# Patient Record
Sex: Female | Born: 1970 | Race: White | Hispanic: No | Marital: Married | Smoking: Former smoker
Health system: Southern US, Community
[De-identification: ages and names within clinical notes are randomized; demographics above are authoritative.]

## PROBLEM LIST (undated history)

## (undated) DIAGNOSIS — I10 Essential (primary) hypertension: Secondary | ICD-10-CM

## (undated) DIAGNOSIS — G930 Cerebral cysts: Secondary | ICD-10-CM

## (undated) DIAGNOSIS — K219 Gastro-esophageal reflux disease without esophagitis: Secondary | ICD-10-CM

## (undated) DIAGNOSIS — J449 Chronic obstructive pulmonary disease, unspecified: Secondary | ICD-10-CM

## (undated) DIAGNOSIS — D497 Neoplasm of unspecified behavior of endocrine glands and other parts of nervous system: Secondary | ICD-10-CM

## (undated) HISTORY — DX: Neoplasm of unspecified behavior of endocrine glands and other parts of nervous system: D49.7

## (undated) HISTORY — DX: Cerebral cysts: G93.0

## (undated) HISTORY — DX: Chronic obstructive pulmonary disease, unspecified: J44.9

## (undated) HISTORY — DX: Gastro-esophageal reflux disease without esophagitis: K21.9

---

## 1997-10-20 ENCOUNTER — Emergency Department (HOSPITAL_COMMUNITY): Admission: EM | Admit: 1997-10-20 | Discharge: 1997-10-20 | Payer: Self-pay | Admitting: Emergency Medicine

## 1997-10-21 ENCOUNTER — Emergency Department (HOSPITAL_COMMUNITY): Admission: EM | Admit: 1997-10-21 | Discharge: 1997-10-21 | Payer: Self-pay | Admitting: Emergency Medicine

## 1998-04-08 ENCOUNTER — Ambulatory Visit (HOSPITAL_COMMUNITY): Admission: RE | Admit: 1998-04-08 | Discharge: 1998-04-08 | Payer: Self-pay | Admitting: Family Medicine

## 1998-12-07 ENCOUNTER — Encounter: Payer: Self-pay | Admitting: *Deleted

## 1998-12-07 ENCOUNTER — Emergency Department (HOSPITAL_COMMUNITY): Admission: EM | Admit: 1998-12-07 | Discharge: 1998-12-07 | Payer: Self-pay | Admitting: *Deleted

## 1999-07-10 HISTORY — PX: TUBAL LIGATION: SHX77

## 1999-09-22 ENCOUNTER — Encounter: Payer: Self-pay | Admitting: Obstetrics and Gynecology

## 1999-09-22 ENCOUNTER — Ambulatory Visit (HOSPITAL_COMMUNITY): Admission: RE | Admit: 1999-09-22 | Discharge: 1999-09-22 | Payer: Self-pay | Admitting: Obstetrics and Gynecology

## 2000-01-21 ENCOUNTER — Observation Stay (HOSPITAL_COMMUNITY): Admission: AD | Admit: 2000-01-21 | Discharge: 2000-01-22 | Payer: Self-pay | Admitting: Obstetrics and Gynecology

## 2000-01-22 ENCOUNTER — Encounter: Payer: Self-pay | Admitting: Obstetrics and Gynecology

## 2000-02-13 ENCOUNTER — Encounter (INDEPENDENT_AMBULATORY_CARE_PROVIDER_SITE_OTHER): Payer: Self-pay | Admitting: Specialist

## 2000-02-13 ENCOUNTER — Inpatient Hospital Stay (HOSPITAL_COMMUNITY): Admission: AD | Admit: 2000-02-13 | Discharge: 2000-02-15 | Payer: Self-pay | Admitting: Obstetrics and Gynecology

## 2004-07-06 ENCOUNTER — Encounter: Admission: RE | Admit: 2004-07-06 | Discharge: 2004-07-06 | Payer: Self-pay | Admitting: Orthopedic Surgery

## 2004-07-09 HISTORY — PX: BACK SURGERY: SHX140

## 2004-09-01 ENCOUNTER — Encounter: Admission: RE | Admit: 2004-09-01 | Discharge: 2004-11-30 | Payer: Self-pay | Admitting: Anesthesiology

## 2004-09-05 ENCOUNTER — Ambulatory Visit: Payer: Self-pay | Admitting: Anesthesiology

## 2004-09-25 ENCOUNTER — Encounter: Admission: RE | Admit: 2004-09-25 | Discharge: 2004-09-25 | Payer: Self-pay

## 2004-11-12 ENCOUNTER — Encounter: Admission: RE | Admit: 2004-11-12 | Discharge: 2004-11-12 | Payer: Self-pay | Admitting: Neurology

## 2004-12-22 ENCOUNTER — Encounter: Admission: RE | Admit: 2004-12-22 | Discharge: 2004-12-22 | Payer: Self-pay | Admitting: Neurology

## 2004-12-25 ENCOUNTER — Encounter: Admission: RE | Admit: 2004-12-25 | Discharge: 2004-12-25 | Payer: Self-pay | Admitting: Neurology

## 2005-01-30 ENCOUNTER — Inpatient Hospital Stay (HOSPITAL_COMMUNITY): Admission: RE | Admit: 2005-01-30 | Discharge: 2005-02-06 | Payer: Self-pay | Admitting: Neurosurgery

## 2005-01-30 ENCOUNTER — Encounter (INDEPENDENT_AMBULATORY_CARE_PROVIDER_SITE_OTHER): Payer: Self-pay | Admitting: *Deleted

## 2005-03-26 ENCOUNTER — Encounter: Admission: RE | Admit: 2005-03-26 | Discharge: 2005-06-24 | Payer: Self-pay | Admitting: Neurosurgery

## 2005-06-25 ENCOUNTER — Encounter: Admission: RE | Admit: 2005-06-25 | Discharge: 2005-09-23 | Payer: Self-pay | Admitting: Neurosurgery

## 2005-07-06 IMAGING — CT CT T SPINE W/ CM
4 of 5 series · 15 of 33 positions shown, 18 images · non-contrast
Comparison: none

CLINICAL DATA: Bilateral lower extremity weakness. Recent MR suggested arachnoid
cyst  posterior to the mid thoracic cord with mild mass-effect.

[Series 3: recon 2: t spine · axial · 0.33mm/px · z∈[-208,-98]mm · 2 of 134 slices shown]
[im 45/134  bone]
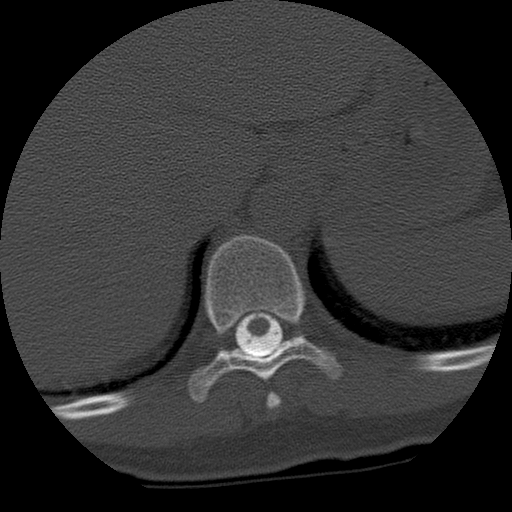
[im 89/134  bone]
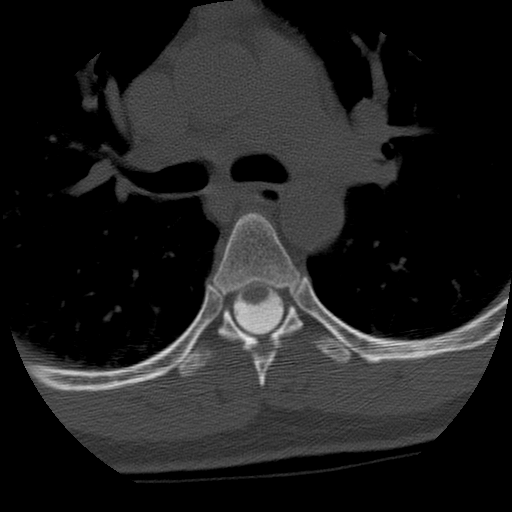

[Series 4: recon 3: t spine · axial · 0.33mm/px · z∈[-262,-42]mm · 5 of 266 slices shown, 7 images]
[im 45/266  soft-tissue]
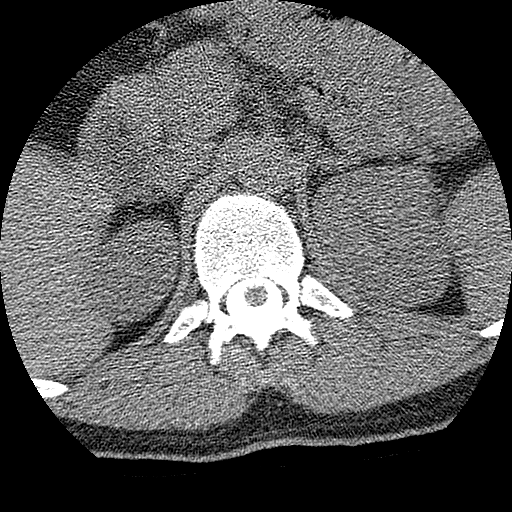
[im 45/266  bone]
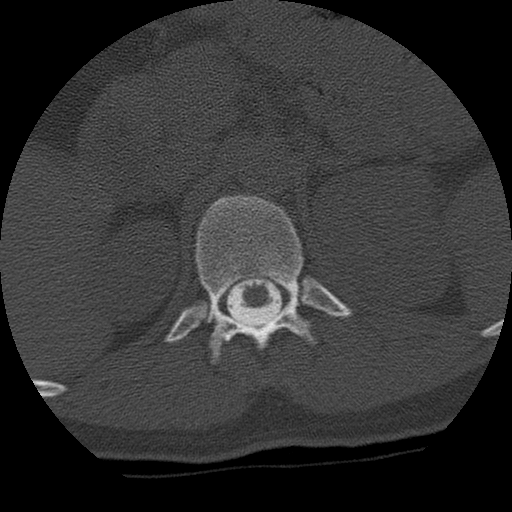
[im 89/266  bone]
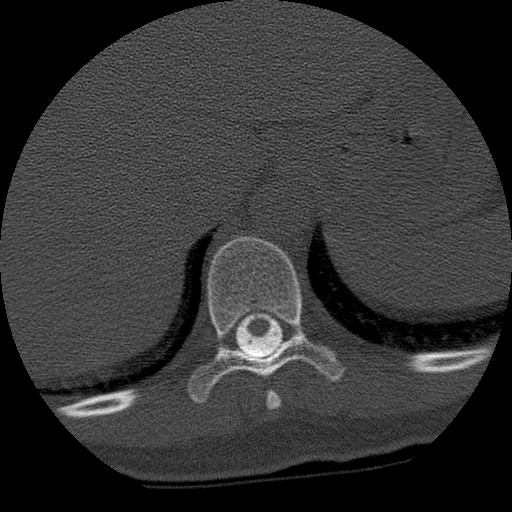
[im 133/266  bone]
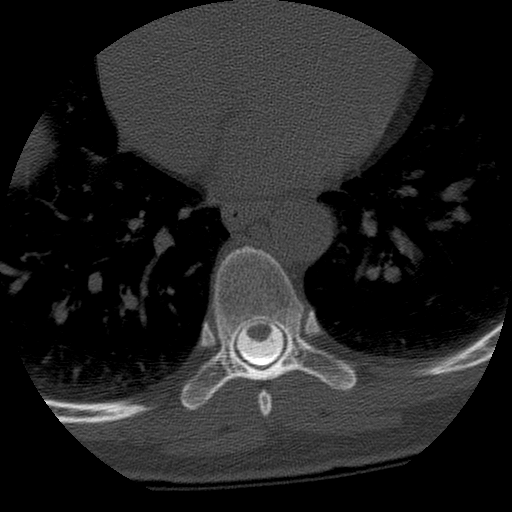
[im 177/266  bone]
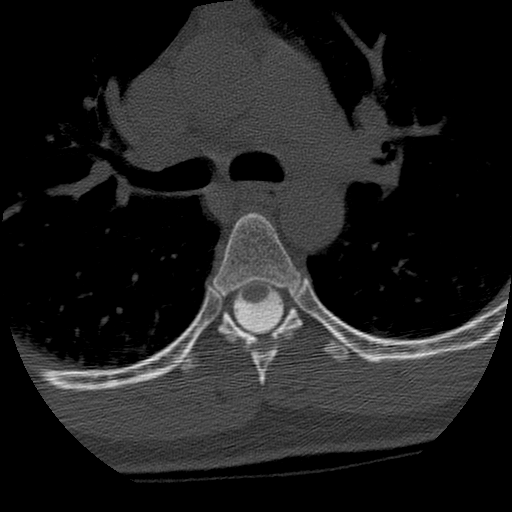
[im 221/266  soft-tissue]
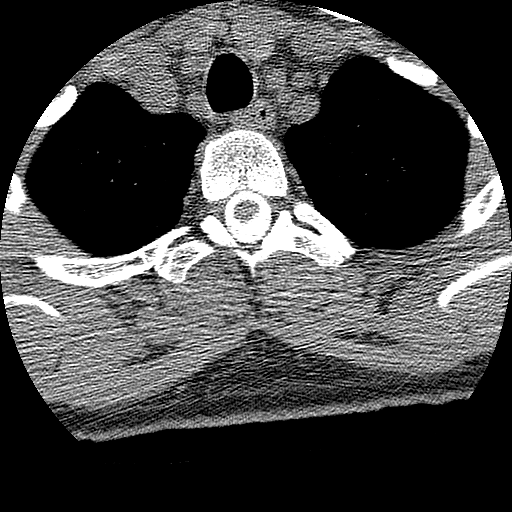
[im 221/266  bone]
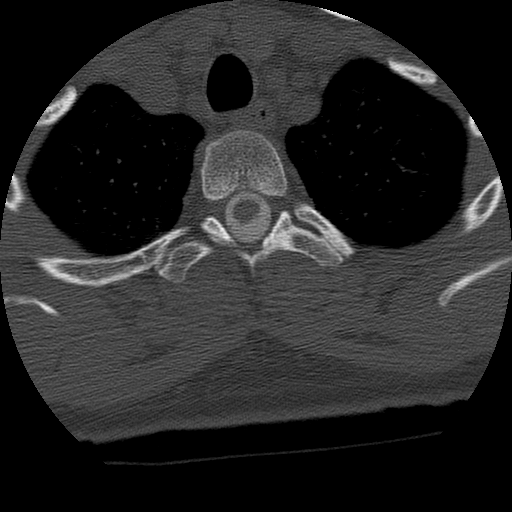

[Series 400: reformatted · sagittal · 0.67mm/px · 5 of 40 slices shown, 6 images (1 of 2)]
[im 14/40  bone]
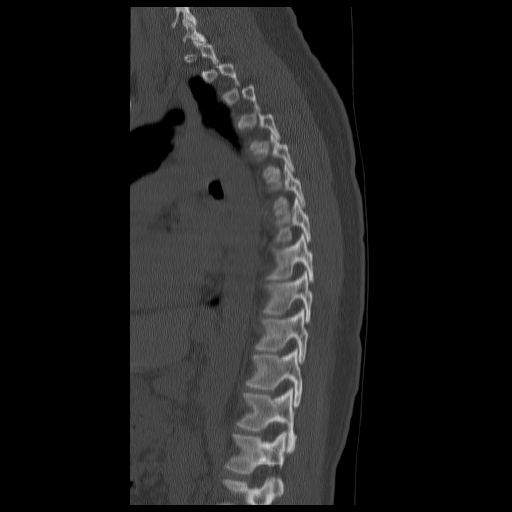
[im 17/40  bone]
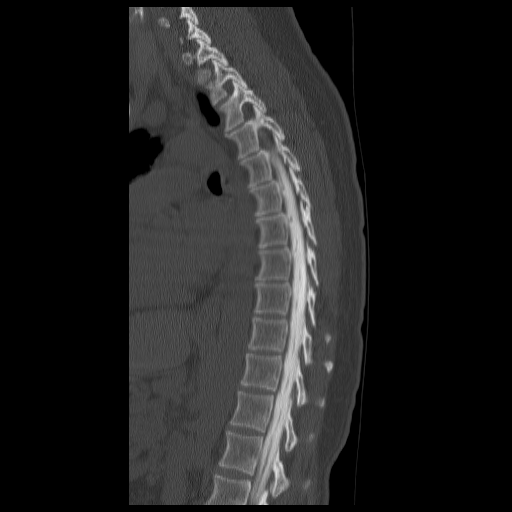
[im 20/40  soft-tissue]
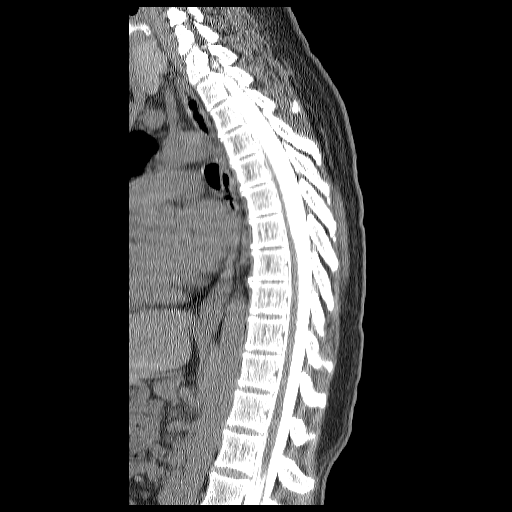
[im 20/40  bone]
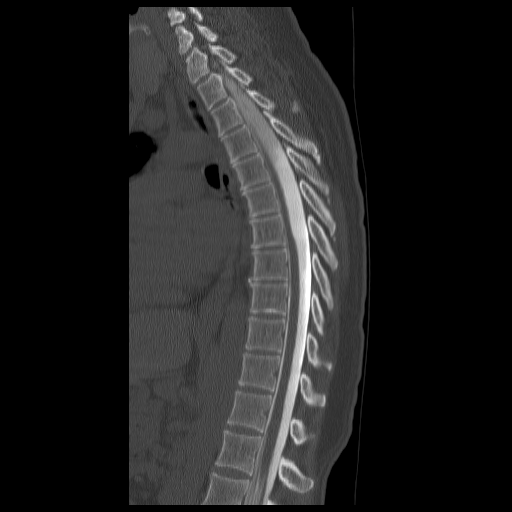
[im 23/40  bone]
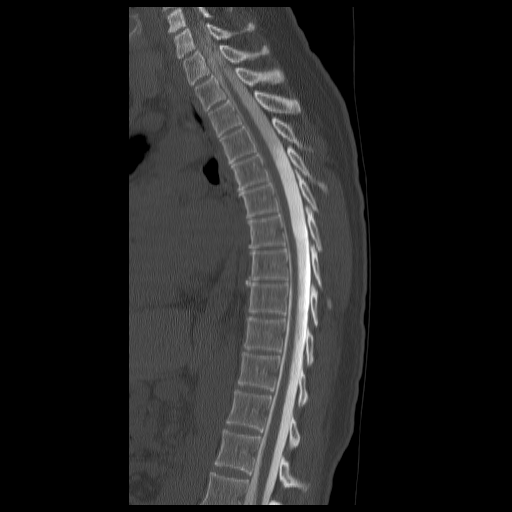
[im 27/40  bone]
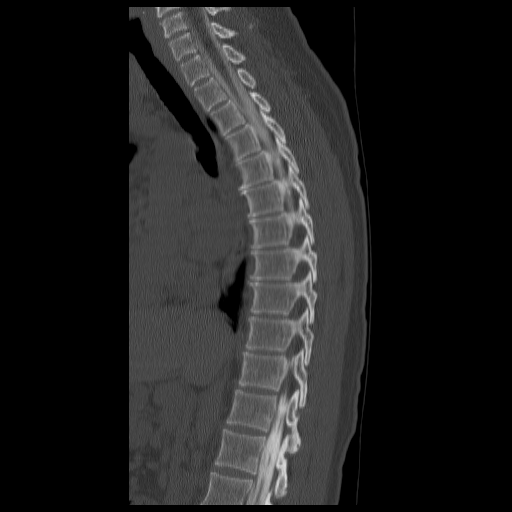

[Series 401: reformatted · coronal · 0.67mm/px · 3 of 40 slices shown (2 of 2)]
[im 8/40  bone]
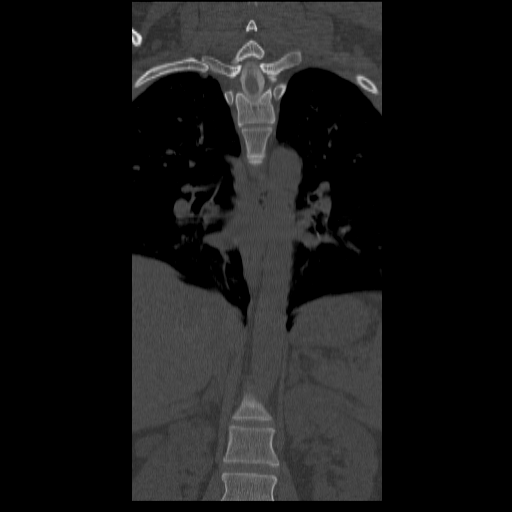
[im 16/40  bone]
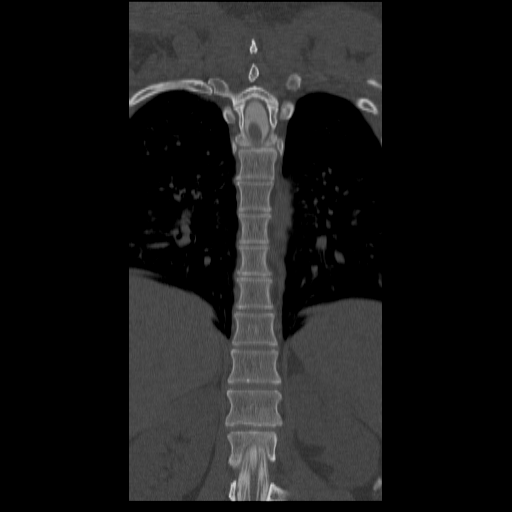
[im 24/40  bone]
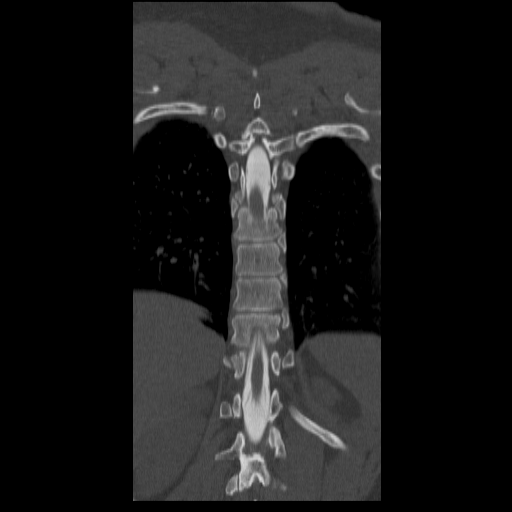

[15 of 33 positions shown; findings below may reference images not displayed]

THORACIC MYELOGRAM:
CT THORACIC SPINE WITH INTRATHECAL CONTRAST:

An appropriate skin entry site under fluoroscopy was identified. Site was
prepped with Betadine, and draped in usual sterile fashion, and infiltrated
locally with 1% lidocaine. A 22 gauge spinal needle was  advanced into the
thecal sac at L4 -5 from a right interlaminar approach . Clear colorless CSF
returned. 10 ml Omnipaque 300 were administered intrathecally for thoracic
myelography, followed by axial CT scanning of the thoracic spine. Coronal and
sagittal reconstructions were generated from the axial scan.

Small anterior plate spurs are noted T2-T7. Normal alignment.

C4-T2: Unremarkable

T2-T3: There is a transition in the contrast density this level which may
suggest the upper limit of the   thoracic arachnoid cyst although the margins
are not well defined. There is no cord distortion although the cord is at the
anterior most aspect of the thecal sac.

T3-T4: Unremarkable

T4-T5: There is mild flattening of the thoracic cord in the AP plane. This is
most severe posterior to T5 vertebral body. The sagittal CT reconstructions best
define a subtle contrast change which correlates to the lesion seen on recent
MRI,   representing the inferior margin of the arachnoid cyst.

T6-T11: The thoracic cord resumes a normal shape and positioning in the thecal
sac. No significant degenerative change.

T11-T12: Unremarkable. Conus is is noted behind the T12 vertebral body.
IMPRESSION: 1. The thoracic arachnoid cyst fills with contrast, its margins poorly defined.
The inferior margin is likely at the level of the T5 vertebral body, superior
margin less distinct, possibly at the T2-T3 interspace.

## 2005-07-08 ENCOUNTER — Encounter: Admission: RE | Admit: 2005-07-08 | Discharge: 2005-07-08 | Payer: Self-pay | Admitting: Neurosurgery

## 2006-01-07 ENCOUNTER — Encounter: Admission: RE | Admit: 2006-01-07 | Discharge: 2006-01-07 | Payer: Self-pay | Admitting: Gastroenterology

## 2007-11-21 ENCOUNTER — Encounter: Admission: RE | Admit: 2007-11-21 | Discharge: 2007-11-21 | Payer: Self-pay | Admitting: Neurosurgery

## 2008-03-16 ENCOUNTER — Emergency Department (HOSPITAL_COMMUNITY): Admission: EM | Admit: 2008-03-16 | Discharge: 2008-03-16 | Payer: Self-pay | Admitting: Emergency Medicine

## 2010-01-04 ENCOUNTER — Emergency Department (HOSPITAL_COMMUNITY)
Admission: EM | Admit: 2010-01-04 | Discharge: 2010-01-04 | Payer: Self-pay | Source: Home / Self Care | Admitting: Emergency Medicine

## 2010-01-11 ENCOUNTER — Encounter: Admission: RE | Admit: 2010-01-11 | Discharge: 2010-01-11 | Payer: Self-pay | Admitting: Family Medicine

## 2010-09-24 LAB — BASIC METABOLIC PANEL
BUN: 10 mg/dL (ref 6–23)
CO2: 27 mEq/L (ref 19–32)
Calcium: 8.8 mg/dL (ref 8.4–10.5)
Creatinine, Ser: 0.67 mg/dL (ref 0.4–1.2)
GFR calc non Af Amer: >60 mL/min (ref >60)
Glucose, Bld: 98 mg/dL (ref 70–99)
Sodium: 136 mEq/L (ref 135–145)

## 2010-09-24 LAB — ETHANOL: Alcohol, Ethyl (B): <5 mg/dL (ref 0–10)

## 2010-09-24 LAB — RAPID URINE DRUG SCREEN, HOSP PERFORMED
Amphetamines: NOT DETECTED
Benzodiazepines: POSITIVE — AB
Opiates: POSITIVE — AB
Tetrahydrocannabinol: NOT DETECTED

## 2010-09-24 LAB — CBC
HCT: 39.2 % (ref 36.0–46.0)
Hemoglobin: 13.7 g/dL (ref 12.0–15.0)
Platelets: 207 10*3/uL (ref 150–400)

## 2010-09-24 LAB — POCT PREGNANCY, URINE: Preg Test, Ur: NEGATIVE

## 2010-11-24 NOTE — H&P (Signed)
[place]  Patient:    [name], [name]                   MRN: [id number] Adm. Date:  [id number] Disc. Date: 12/26/99 Attending:  Malon Kindle                         History and Physical  HISTORY OF PRESENT ILLNESS:  This is a 40 year old married white female, GIII, PII/0/II with EDC of 02/14/00 by dates and 02/21/00 by ultrasound, admitted for  induction because of a recent history of third trimester bleeding and decreased  fetal movement in a favorable cervix.  Blood group/type: A positive, negative antibody, nonreactive serology, rubella immune and hepatitis-B surface antigen negative.  HIV negative.  GC and chlamydia negative.  Triple screen normal.  One hour glucola 132.  Group B Strep negative.  The patient had a relatively benign prenatal course except for treatment with Zee-Pak for sinusitis, Antivert for dizziness and evaluation of third trimester  bleeding.  ALLERGIES:  No known allergies.  PAST SURGICAL HISTORY:  No operations.  PAST MEDICAL HISTORY:  The usual childhood diseases.  She did have PIH with the  first pregnancy.  FAMILY HISTORY:  Mother with high blood pressure, diabetes, heart disease, kidney failure and uterine cancer.  Father with diabetes.  HABITS:  Alcohol, tobacco, drugs - none.  OBSTETRIC HISTORY:  September, 1990, March, 1994 - the patient had vaginal deliveries of 6 lb 14 oz female and 8 lb 4 oz female respectively.  PHYSICAL EXAMINATION:  VITAL SIGNS:  Normal.  HEART:  Showed normal sinus rhythm without murmurs.  LUNGS:  Clear.  ABDOMEN:  Soft, gravid.  Cervix 2-3 cm.  The patient was placed on Pitocin.  She then had rupture of membranes.  She was  given an epidural.  Progressed rapidly to full dilatation and delivered a 7 lb oz female spontaneously, with APGARS of 9 at 1 minute, and 9 at 5 minutes over an intact perineum, by Dr. Ambrose Mantle.  The placenta was removed  intact.  There was mild shoulder dystocia, treated with woodscrew maneuver and ______s maneuver.  The patient requested tubal ligation.  She had signed her Medi[employer] forms well in advance, and is prepared for tubal ligation.  IMPRESSION: 1. Intrauterine pregnancy at 38 weeks 6 days, delivered vertex 2. Voluntary sterilization.  PLAN:  The patient is prepared for tubal ligation.  She is given instructions that tubal ligation has a failure rate, somewhere around 1 in 100, although it might be as high as 1 in 10 and as low as 1 in 200-300.  There is some risk of injury to  neighboring structures.  The patient understands, and agrees to proceed. DD:  02/13/00 TD:  02/13/00 Job: [id number]

## 2010-11-24 NOTE — H&P (Signed)
[name], [name] NO.:  [id number]   MEDICAL RECORD NO.:  [id number]          PATIENT TYPE:  INP   LOCATION:                               FACILITY:  [facility]   PHYSICIAN:  Payton Doughty, M.D.      DATE OF BIRTH:  1970-10-04   DATE OF ADMISSION:  01/30/2005  DATE OF DISCHARGE:                                HISTORY & PHYSICAL   ADMITTING DIAGNOSIS:  Thoracic arachnoid cyst T4-T7.   HISTORY OF PRESENT ILLNESS:  This is a very nice 40 year old right-handed  white female who for numerous years had pain in her back but since last  October has had increasing pain out toward both hips with occasional  radiation into her legs.  She had incapacitating back and hip pain  bilaterally and on an episodic basis had difficulty with swallowing.  She  has visited numerous doctors and neurologists who obtained an MRI of her  brain and upper thoracic spine because of spasticity in her legs and found  what was probably an arachnoid cyst at T4 extending to T7.  She has had  complaints of her back, buttocks and SI joints. She said she has weakness of  the legs.  She has been off balance with numbness and tingling.  Her bladder  has not been affected.   PAST MEDICAL HISTORY:  Medical history is reasonably normal.   MEDICATIONS:  She has been using Percocet.   ALLERGIES:  She is allergic to TALWIN, VOLTAREN, CYMBALTA.  She did not like  Lyrica.   PAST SURGICAL HISTORY:  Tubal ligation.   SOCIAL HISTORY:  She smokes 1/2 pack of cigarettes per day.  She does not  drink alcohol and currently is not working because of pain in her back.   FAMILY HISTORY:  Her parents are deceased, mother at 3 and her father at  36.  There is a history of diabetes, hypertension, heart failure, migraines  and cancer.   REVIEW OF SYSTEMS:  Remarkable for balance disturbance, irregular pulse,  shortness of breath, indigestion, change in bowel habits, arm weakness, leg  weakness, back pain, leg pain,  joint pain, hard to swallow on an episodic  basis.   PHYSICAL EXAMINATION:  HEENT:  Exam is within normal limits.  NECK:  She has reasonable range of motion of her neck.  CHEST:  Clear.  CARDIAC:  Regular rate and rhythm.  ABDOMEN:  Nontender.  No hepatosplenomegaly.  EXTREMITIES:  Without clubbing or cyanosis.  Peripheral pulses are good.  GENITOURINARY:  Exam is deferred.  NEUROLOGIC:  She is awake, alert and oriented.  Cranial nerves are intact.  Motor exam shows 5/5 strength throughout the upper extremities.  Lower  extremities:  She has some weakness, some which is functionally related pain  with hip flexion.  She has nearly full strength on the left, full strength  on the right in hip flexors, knee extensors, knee flexors, dorsiflexors and  plantar flexors.  Reflexes are 3+ at the knees, 3+ at the ankles.  There is  no clonus.  She has positive cross reductors.  Upper extremity reflexes  are  2 with no Hoffman.   LABORATORY DATA:  She comes accompanied with MRI of the brain which is  unremarkable.  MRI of the thoracic spine shows arachnoid cyst at T4 and T7.  This is also confirmed on myelography.   There is no fat.   CLINICAL IMPRESSION:  Myelopathy associated with arachnoid cyst.  I think  the most reasonable thing to do is to decompress this.  I explained to him  in great detail that I do not think this will help her hip or back pain but  I think it is important because of the compression on her spinal cord. The  risks and benefits of this approach have been discussed with her and she  wishes to proceed.           ______________________________  Payton Doughty, M.D.     MWR/MEDQ  D:  01/30/2005  T:  01/30/2005  Job:  [id number]

## 2010-11-24 NOTE — Discharge Summary (Signed)
[name], [name] NO.:  [id number]   MEDICAL RECORD NO.:  [id number]          PATIENT TYPE:  INP   LOCATION:  3027                         FACILITY:  [facility]   PHYSICIAN:  Payton Doughty, M.D.      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   OPERATIVE PROCEDURE:  T4 to T7 laminectomy for resection of arachnoid cyst.   SERVICE:  Neurosurgery.   COMPLICATIONS:  None.   DISCHARGE STATUS:  Alive and well.   BODY OF TEXT:  [name] whose history  and physical is recounted in the chart.  She has had a lot of complaints of  pain in her back and legs and visits numerous doctors.  MR demonstrated an  arachnoid  cyst at T4 to T7; she was admitted for resection.   MEDICAL HISTORY:  Benign.   ALLERGIES:  She is allergic to TALWIN, VOLTAREN and CYMBALTA and did not  like Lyrica.   MEDICATIONS:  Has been using Percocet for medication.   SOCIAL HISTORY:  She smoked a half a pack of cigarettes a day, does not  drink alcohol and does not work.   PHYSICAL EXAMINATION:  GENERAL:  General exam was intact.  NEUROLOGIC:  Exam showed full strength in the upper extremities.  She has  weakness in the hip flexors.  She was somewhat hyperreflexic with no clonus  and positive crossed adductors.   IMAGING STUDIES:  MR demonstrated a thoracic cyst from T4 to T7.   HOSPITAL COURSE:  She was admitted after ascertainment of normal laboratory  values and underwent resection of her cyst.  Postoperatively, she was in the  ICU for a couple of days for help getting up and about.  Her strength has  been full.  She was transferred out to the floor over the weekend and has  been participating in  physical therapy; she refused to participate a couple  of times because of pain.  She is now up and about with some prodding.  She  is only on Percocet for pain.  Her strength is full, her incision is well-  healing and her vital signs are stable.  She had a  plethora of complaints, none of which are substantive or require her to stay  in the hospital.  She is being discharged home in the care of her family.   FOLLOWUP:  Followup will be in the [facility] office  in a week for suture removal.       MWR/MEDQ  D:  02/06/2005  T:  02/06/2005  Job:  [id number]

## 2010-11-24 NOTE — Discharge Summary (Signed)
[facility]  PatientAMELYA, [name]                   MRN: [id number] Adm. Date:  [id number] Disc. Date: 02/15/00 Attending:  Malon Kindle                           Discharge Summary  HISTORY OF PRESENT ILLNESS:       A 40 year old white, married female, para 2-0-0-2, gravida 3, last period May 10, 1999, [facility] February 14, 2000, by dates and February 21, 2000, by ultrasound, admitted for induction of labor because of a favorable cervix and recently perceived decreased fetal movement and history of third trimester bleeding of unknown origin.  LABORATORY DATA:                  Blood group and type A positive with a negative antibody, nonreactive serology, rubella immune, hepatitis B surface antigen negative, HIV negative, GC and chlamydia negative. Triple screen normal.  One-hour glucola 132.  Group B strep negative.  PRENATAL COURSE:                  Outlined in her History and Physical.  HOSPITAL COURSE:                  She was admitted to the hospital, underwent induction of labor, progressed to full dilatation, and delivery occipitoanterior over an intact perineum by Dr. Ambrose Mantle a living female infant, 7 pounds 6 ounces, Apgars 9 at 1 and 9 at 5 minutes.  The placenta was intact. The uterus was normal.  Blood loss was about 400 cc.  Mild shoulder dystocia was managed with Woods screw and McRoberts maneuvers.                                    Postpartum, the patient requested tubal ligation.  She had signed papers well in advance.  I spoke to her about the disadvantages of tubal ligation, but she wanted to proceed.  Actually, when she got to the operating room, she was quite nervous about the environment of the operating room but decided to proceed on with the surgery.  She did well postoperatively and was discharged on the second postpartum and first postoperative days.  RPR was nonreactive.  Hemoglobin on admission was  10.6, hematocrit 31.4, white count 7800, platelet count 172,000.  Followup hemoglobin 10.1, hematocrit 29.4, platelet count 166,000, white count 10,300.  FINAL DIAGNOSES:                  1. Intrauterine pregnancy at 39 weeks,                                      delivered occipitoanterior.                                   2. Mild shoulder dystocia.                                   3. Voluntary sterilization.  OPERATIONS:  1. Spontaneous delivery occipitoanterior.                                   2. Bilateral tubal ligation.  FINAL CONDITION:                  Improved.  DISCHARGE INSTRUCTIONS:           Include our regular discharge instruction booklet.  The patient is advised to primarily subsist on liquids until she passes gas, and she may use a suppository as needed for stimulation of passage of gas.  DISCHARGE MEDICATIONS:            Prescription for Percocet 5/325, 16 tablets, 1 every 4 to 6 hours as needed for pain.  FOLLOWUP:                         She is to return in 10 to 14 days. DD:  02/15/00 TD:  02/15/00 Job: [id number]

## 2010-11-24 NOTE — Op Note (Signed)
NAMESONJI, STARKES NO.:  [id number]   MEDICAL RECORD NO.:  [id number]          PATIENT TYPE:  INP   LOCATION:  2899                         FACILITY:  [facility]   PHYSICIAN:  Payton Doughty, M.D.      DATE OF BIRTH:  07-Oct-1970   DATE OF PROCEDURE:  01/30/2005  DATE OF DISCHARGE:                                 OPERATIVE REPORT   PREOPERATIVE DIAGNOSIS:  Thoracic arachnoid cyst.   POSTOPERATIVE DIAGNOSIS:  Thoracic arachnoid cyst.   OPERATION PERFORMED:  T3 to T8 laminectomy for arachnoid cyst resection.   SURGEON:  Payton Doughty, M.D.   ANESTHESIA:  General endotracheal.   PREP:  Sterile Betadine prep and scrub with alcohol wipe.   COMPLICATIONS:  None.   NURSE ASSISTANT:  [place].   DOCTOR ASSISTANT:  Clydene Fake, M.D.   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   DESCRIPTION OF PROCEDURE:  The patient was taken to the operating room,  smoothly anesthetized, intubated, and placed prone on the operating table.  She was in the [place] head holder.  Following shave, prep and drape in the  usual sterile fashion.  Skin was infiltrated with 1% lidocaine 1:400,000  epinephrine.  Skin was initially incised at T4-5.  A marker was placed up  high in the neck and then as far as could be reached from the field and x-  rays obtained.  The T3 level was identified and the lamina of T3, T4, T5,  T6, T7 and T8 were exposed bilaterally in the subperiosteal plane.  There  was a fair amount of bleeding from the epispinal plexus.  Laminectomy was  carried out from T3 to T8 and the __________ was controlled with bipolar  cautery and Thrombin Gelfoam.  Intraoperative ultrasound was used to  identify both ends of the cyst which was done without difficulty.  The dura  was opened and the cyst was immediately evident.  The wall was removed  piecemeal.  This resulted in complete decompression of the  cord and  restoration of pulsatile activity of the cord.  There was one thickened  whitish piece of material that was sent off as a specimen and it looked for  all the world like a piece of epidermoid. The wound was irrigated,  hemostasis assured.  The dura was closed with 4-0 Nurolon in a simple  running fashion.  The Valsalva maneuver revealed no leak.  The paraspinous  muscles and fascia were reapproximated with 0 Vicryl in interrupted fashion.  Subcutaneous tissue was reapproximated with 2-0 Vicryl in interrupted  fashion.  Skin was closed with 3-0 nylon in a running locked fashion.  Betadine and Telfa dressing was applied and made occlusive with OpSite and  the patient returned to recovery room in good condition.       MWR/MEDQ  D:  01/30/2005  T:  01/30/2005  Job:  [id number]

## 2010-11-24 NOTE — Op Note (Signed)
[facility]  PatientALANNY, [name]                   MRN: [id number] Proc. Date: 02/14/00 Adm. Date:  [id number] Attending:  Malon Kindle                           Operative Report  PREOPERATIVE DIAGNOSIS:       Voluntary sterilization.  POSTOPERATIVE DIAGNOSIS:      Voluntary sterilization.  OPERATION:                    Bilateral tubal ligation.  SURGEON:                      Malachi Pro. Ambrose Mantle, M.D.  ASSISTANT:  ANESTHESIA:                   Epidural anesthesia.  DESCRIPTION OF PROCEDURE:     The patient was brought to the operating room and was quite apprehensive about the surroundings in the operating room.  She initially felt like she could not go through with the surgery but later decided that she could. She definitely wanted the tubal ligation, she just did not like the idea of having surgery.  The patients abdomen was prepped with Betadine solution and draped as a sterile field.  A semilunar incision was made in the inferior portion of the umbilicus and carried in layers down through the subcutaneous tissue to the fascia.  The fascia was identified and incised and then the peritoneal cavity was entered.  I had to use two sponges to pack the bowel away so I could see the operative field. The right tube was elevated into the operative field.  the right ovary felt normal but I did not see it.  I traced the right tube to its fimbriated end, made a window in the mesosalpinx with the cautery, placed two ties proximally and distally across the mid portion of the tube and then excised about a 1 cm area of the tube between the ties.  The left tube was traced to its fimbriated end.  It was handled in exactly the same fashion.  A larger segment of tube was noted on the left and the right.  There was no bleeding.  The packs were removed. The peritoneum and fascia were closed in one layer using three interrupted figure-of-eight  sutures of 0 Vicryl.  The subcutaneous tissue was not closed. The skin was closed with 3-0 plain catgut interrupted sutures.  The patient seemed to tolerate the procedure well.  Blood loss was less than 10 cc. Sponge and needle counts were correct and she was returned to recovery in satisfactory condition. DD:  02/14/00 TD:  02/15/00 Job: [id number]

## 2010-11-24 NOTE — Assessment & Plan Note (Signed)
PATIENT:  [name].   DATE OF BIRTH:  03/10/1971.   SURGEON:  Jewel Baize. Stevphen Rochester, M.D.   [name] [facility]. I have  reviewed the Health and history form. I reviewed the 14 point review of  systems. I have reviewed available records, progress to date. I reviewed the  MRI. Referral from Dr. Lunette Stands.   [name] is a pleasant 40 year old individual who is married with three  children who comes to [place] complaining of whole body pain. She also has  paralumbar discomfort and some left lower extremity discomfort, infragluteal  described as a 4/5 and 5/1 distribution. Her pain is suprascapular,  infragluteal, abdominal, and described many times as interfering with all  activities of daily living. Queried as to risk environment with a risk  inventory directed toward child care, she realizes she has sometimes  difficulty taking care of her 64-year-old. I asked her if she needs help with  Social Services but she does have apparently have a mother-in-law that helps  and her husband helps. We will be mindful of this as we perform regular risk  inventory. She relates no specific injury.   I review the MRI which reveals only modest degenerative changes and she  possibly has a facetal overlay improved with heat and medication. She is  taking about four to six Percocet a day. She is improved with walking and  bending. She has had cortisone shots in the past and then a side joint  injection met with escalation in pain. It was made worse with walking,  bending, and sitting.   PAST SURGICAL HISTORY:  Her only surgical history is remarkable for tubal  ligation. She has been seen by Primary Care and orthopedist and she is a  nonsurgical candidate.   SOCIAL HISTORY:  She is currently not working. Social history also reveals  one pack per day x 10 years.   FAMILY HISTORY:  Heart disease, cancer, diabetes, hypertension. Does not  declare disability.   REVIEW OF SYMPTOMS:  As found in the social history, otherwise  noncontributory to the pain problem. She denies any previous or current  abuse. She denies any previous or current drug or alcohol abuse.   PHYSICAL EXAMINATION:  GENERAL:  A pleasant anxious female sitting  comfortable in bed. Pressured speech. Gait, affect, and appearance was  normal. Oriented x 3. High level of pain behavior. Chaperoned visit.  HEENT:  Unremarkable.  CHEST:  Clear to auscultation and percussion.  HEART:  She has a regular rate and rhythm without rub, murmur, or gallop.  ABDOMEN:  Soft, nontender, benign. No hepatosplenomegaly.  MUSCULOSKELETAL:  She has diffuse suprascapular, paracervical, and  paralumbar myofascial discomfort distractible. Straight leg lift not  impaired. EHL is fine. Vascular exam unremarkable. Otherwise mostly  myofascial presentation. Waddell's 2/5.  NEUROLOGICAL:  No other overt neurological deficit, motor, sensory reflexes.   IMPRESSION:  1.  Cervicalgia.  2.  Degenerative spinal disease cervical spine.  3.  Degenerative spinal disease lumbar spine.  4.  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  5.  Myofascial pain syndrome.  6.  Anxiety disorder, somatization disorder, possible conversion disorder.   PLAN:  1.  It is recommended at another pain clinic that she receive a lumbar      epidural. As she describes her high level of pain after the SI joint      injection, which was performed by expert injectionist under fluoroscopic      observation,  I am going to hold off interventional procedures at this      time until I have a better assessment.  2.  She probably has a facetal overlay and I discussed modifiable features      and health profile which will improve her overall outcome. This includes      cigarette cessation and she will maintain contact with primary care.  3.  I am going to be mindful of potential risks environment at home,      although I carefully broached the  subject today, I am probably going to      have send Social Services in at some point just to assess the      environment.  4.  I am going to move away of controlled substances as a risk item. I do      not think they are necessary at this time to control her symptoms and I      will approach her in a multi-modality standpoint. I am also with full      and informed consent going to obtain a drug screen on her today;      although I do not suspect eversion at this time, I want to be very clear      on medication usages. I am going to introduce Klonopin and possibly move      up the analgesic ladder.  5.  She brings her pills in. She is appropriate to count, nine tablets left      #5. Instructed to maintain contact with primary care.  6.  I will be following up with her in two weeks to see how she does.      Initiation of care includes TENS technology, a Lidoderm, Duraback for      pain control, Klonopin to address the anxiety, and we will reassess      efficacy. I will probably send her for a psychological assessment at      some point, but I would like to see how she does here, and we will move      slowly forward. I do think she has ability to maintain as a vital member      to society and I do not think she is a disability candidate at this      time. We believe that in her current state she is not a strong candidate      for interventional procedures but we will reserve that at a later date      if necessary.   DISCHARGE INSTRUCTIONS:  Discharge instructions revealed. No barriers to  communication.      HH/MedQ  D:  09/05/2004 11:59:02  T:  09/05/2004 12:33:20  Job #:  [id number]   cc:   Lunette Stands, M.D.  [address]  [place] [address]  Fax: [phone]

## 2011-06-07 ENCOUNTER — Other Ambulatory Visit: Payer: Self-pay | Admitting: Family Medicine

## 2011-06-07 DIAGNOSIS — E041 Nontoxic single thyroid nodule: Secondary | ICD-10-CM

## 2011-06-13 ENCOUNTER — Ambulatory Visit
Admission: RE | Admit: 2011-06-13 | Discharge: 2011-06-13 | Disposition: A | Discharge status: Home / Self Care | Payer: Medicaid Other | Source: Ambulatory Visit | Attending: Family Medicine | Admitting: Family Medicine

## 2011-06-13 DIAGNOSIS — E041 Nontoxic single thyroid nodule: Secondary | ICD-10-CM

## 2011-08-10 ENCOUNTER — Institutional Professional Consult (permissible substitution): Payer: Medicaid Other | Admitting: Pulmonary Disease

## 2011-08-23 ENCOUNTER — Encounter: Payer: Self-pay | Admitting: Internal Medicine

## 2011-08-23 ENCOUNTER — Ambulatory Visit (INDEPENDENT_AMBULATORY_CARE_PROVIDER_SITE_OTHER): Payer: Medicaid Other | Admitting: Internal Medicine

## 2011-08-23 VITALS — BP 142/84 | HR 120 | Temp 98.3°F | Ht 59.5 in | Wt 145.8 lb

## 2011-08-23 DIAGNOSIS — R05 Cough: Secondary | ICD-10-CM

## 2011-08-23 DIAGNOSIS — R0609 Other forms of dyspnea: Secondary | ICD-10-CM

## 2011-08-23 DIAGNOSIS — Z720 Tobacco use: Secondary | ICD-10-CM | POA: Insufficient documentation

## 2011-08-23 DIAGNOSIS — R0600 Dyspnea, unspecified: Secondary | ICD-10-CM

## 2011-08-23 DIAGNOSIS — F17200 Nicotine dependence, unspecified, uncomplicated: Secondary | ICD-10-CM

## 2011-08-23 MED ORDER — AMOXICILLIN-POT CLAVULANATE 875-125 MG PO TABS: 1.0000 | ORAL_TABLET | Freq: Two times a day (BID) | ORAL | Status: AC

## 2011-08-23 MED ORDER — FAMOTIDINE 20 MG PO TABS: ORAL_TABLET | ORAL | Status: DC

## 2011-08-23 MED ORDER — DEXLANSOPRAZOLE 30 MG PO CPDR: DELAYED_RELEASE_CAPSULE | ORAL | Status: DC

## 2011-08-23 MED ORDER — FORMOTEROL FUMARATE 20 MCG/2ML IN NEBU: 20.0000 ug | INHALATION_SOLUTION | Freq: Two times a day (BID) | RESPIRATORY_TRACT | Status: DC

## 2011-08-23 MED ORDER — TRAMADOL HCL 50 MG PO TABS: ORAL_TABLET | ORAL | Status: AC

## 2011-08-23 NOTE — Assessment & Plan Note (Signed)
Prominent pseudowheeze on exam makes it difficult to tell how much actual lung dz she has and the hfa may be contributing to cough so change to neb formoterol for now and work on stopping cigs, f/u pft's p get cough under control

## 2011-08-23 NOTE — Patient Instructions (Signed)
The key to effective treatment for your cough is eliminating the non-stop cycle of cough you're stuck in long enough to let your airway heal completely and then see if there is anything still making you cough once you stop the cough suppression, but this should take no more than 5 days to figuure out  First take mucinex dm 2  every 12 hours and supplement if needed with  tramadol 50 mg up to 2 every 4 hours to suppress the urge to cough. Swallowing water or using ice chips/non mint and menthol containing candies (such as lifesavers or sugarless jolly ranchers) are also effective.  You should rest your voice and avoid activities that you know make you cough.  Once you have eliminated the cough for 3 straight days try reducing the tramadol first,  then the mucinex dm  Try dexilant  30 mg x2  Take 30-60 min before first meal of the day and Pepcid 20 mg one bedtime until cough is completely gone for at least a week without the need for cough suppression    GERD (REFLUX)  is an extremely common cause of respiratory symptoms, many times with no significant heartburn at all.    It can be treated with medication, but also with lifestyle changes including avoidance of late meals, excessive alcohol, smoking cessation, and avoid fatty foods, chocolate, peppermint, colas, red wine, and acidic juices such as orange juice.  NO MINT OR MENTHOL PRODUCTS SO NO COUGH DROPS  USE SUGARLESS CANDY INSTEAD (jolley ranchers or Stover's)  NO OIL BASED VITAMINS - use powdered substitutes.    Augmentin x 10 days  If short of breath only use perforomist every 12 hours per neb  Please schedule a follow up office visit in 2 weeks, sooner if needed

## 2011-08-23 NOTE — Progress Notes (Signed)
  Subjective:    Patient ID: [name], female    DOB: 20-Aug-1970, 41 y.o.   MRN: [id number]  HPI  77  yowf smoking used 02 tent as infant and in 4th or 5th grade then did fine thru HS cheerleading though felt breathing no quite right and onset doe onset 2012 referred 08/23/2011 by Dr Jeannetta Nap to pulmonary clinic  08/23/2011 1st pulmonary ov actively smoking  cc sob x one year assoc with cough starting oct 2012 productive of green and yellow mucus omnicef rx ? May have some but did not help breathing or coughing severe to point of going to ER 2/9 no better on prednisone on this course, maybe better with ventolin last used 3 hours prior to OV .  Sleeping poorly with noct and   early am exacerbation  of respiratory  c/o's and  need for freq noct saba. Denies any obvious fluctuation of symptoms with weather or environmental changes or other aggravating or alleviating factors except as outlined above   Review of Systems  Constitutional: Negative for fever, chills and unexpected weight change.  HENT: Negative for ear pain, nosebleeds, congestion, sore throat, rhinorrhea, sneezing, trouble swallowing, dental problem, voice change, postnasal drip and sinus pressure.   Eyes: Negative for visual disturbance.  Respiratory: Positive for cough and shortness of breath. Negative for choking.   Cardiovascular: Positive for chest pain. Negative for leg swelling.  Gastrointestinal: Negative for vomiting, abdominal pain and diarrhea.  Genitourinary: Negative for difficulty urinating.  Musculoskeletal: Negative for arthralgias.  Skin: Negative for rash.  Neurological: Negative for tremors, syncope and headaches.  Hematological: Does not bruise/bleed easily.  Psychiatric/Behavioral: The patient is nervous/anxious.        Objective:   Physical Exam  Anxious very hoarse wf constant coughing during interview and exam and prominent pseudowheeze  08/23/2011  Wt 145  HEENT mild turbinate edema.   Oropharynx no thrush or excess pnd or cobblestoning.  No JVD or cervical adenopathy. Mild accessory muscle hypertrophy. Trachea midline, nl thryroid. Chest was hyperinflated by percussion with diminished breath sounds and mild increased exp time without wheeze. Hoover sign positive at end inspiration. Regular rate and rhythm without murmur gallop or rub or increase P2 or edema.  Abd: no hsm, nl excursion. Ext warm without cyanosis or clubbing.     CxR ant [facility] Salvia not avail but cxr 08/12/09 c/w mild osteo/thoracic kyphosis    Assessment & Plan:

## 2011-08-23 NOTE — Assessment & Plan Note (Signed)
The most common causes of chronic cough in immunocompetent adults include the following: upper airway cough syndrome (UACS), previously referred to as postnasal drip syndrome (PNDS), which is caused by variety of rhinosinus conditions; (2) asthma; (3) GERD; (4) chronic bronchitis from cigarette smoking or other inhaled environmental irritants; (5) nonasthmatic eosinophilic bronchitis; and (6) bronchiectasis.   These conditions, singly or in combination, have accounted for up to 94% of the causes of chronic cough in prospective studies.   Other conditions have constituted no >6% of the causes in prospective studies These have included bronchogenic carcinoma, chronic interstitial pneumonia, sarcoidosis, left ventricular failure, ACEI-induced cough, and aspiration from a condition associated with pharyngeal dysfunction.   .Chronic cough is often simultaneously caused by more than one condition. A single cause has been found from 38 to 82% of the time, multiple causes from 18 to 62%. Multiply caused cough has been the result of three diseases up to 42% of the time.     This is almost certainly  Classic Upper airway cough syndrome, so named because it's frequently impossible to sort out how much is  CR/sinusitis with freq throat clearing (which can be related to primary GERD)   vs  causing  secondary (" extra esophageal")  GERD from wide swings in gastric pressure that occur with throat clearing, often  promoting self use of mint and menthol lozenges that reduce the lower esophageal sphincter tone and exacerbate the problem further in a cyclical fashion.   These are the same pts who not infrequently have failed to tolerate ace inhibitors,  dry powder inhalers or biphosphonates or report having reflux symptoms that don't respond to standard doses of PPI , and are easily confused as having aecopd or asthma flares, as is the case here  For now needs max rx for gerd with diet/ ppi/ h2 hs and empiric rx for  sinusitis then sinus ct next step in 2 weeks if not better

## 2011-08-23 NOTE — Assessment & Plan Note (Addendum)

## 2011-08-31 ENCOUNTER — Institutional Professional Consult (permissible substitution): Payer: Medicaid Other | Admitting: Pulmonary Disease

## 2011-09-07 ENCOUNTER — Encounter: Payer: Self-pay | Admitting: Internal Medicine

## 2011-09-07 ENCOUNTER — Ambulatory Visit (INDEPENDENT_AMBULATORY_CARE_PROVIDER_SITE_OTHER): Payer: Medicaid Other | Admitting: Internal Medicine

## 2011-09-07 DIAGNOSIS — R05 Cough: Secondary | ICD-10-CM

## 2011-09-07 DIAGNOSIS — F17200 Nicotine dependence, unspecified, uncomplicated: Secondary | ICD-10-CM

## 2011-09-07 MED ORDER — PREDNISONE (PAK) 10 MG PO TABS: ORAL_TABLET | ORAL | Status: AC

## 2011-09-07 NOTE — Progress Notes (Signed)
  Subjective:    Patient ID: [name], female    DOB: 1971/06/13    MRN: [id number]   Brief patient profile:  40  yowf smoking used 02 tent as infant and in 4th or 5th grade then did fine thru HS cheerleading though felt breathing no quite right and onset doe onset 2012 referred 08/23/2011 by Dr Jeannetta Nap to pulmonary clinic.   HPI 08/23/2011 1st pulmonary ov actively smoking  cc sob x one year assoc with cough starting oct 2012 productive of green and yellow mucus omnicef rx ? May have helped some but did not help breathing or coughing severe to point of going to ER 2/9 no better on prednisone on this course, maybe better with ventolin last used 3 hours prior to OV . rec cycylical cough regimen Augmentin x 10 days If short of breath only use perforomist every 12 hours per neb   09/07/2011 f/u ov/[facility]aci still smoking  Cough and sob Maybe better while on performist and worse when stopped it and no longer coughing up green stuff, only took pred a few days p ov then stopped.     Sleeping poorly with noct and   early am exacerbation  of respiratory  c/o's and  need for freq noct saba. Denies any obvious fluctuation of symptoms with weather or environmental changes or other aggravating or alleviating factors except as outlined above.  ROS  At present neg for  any significant sore throat, dysphagia, itching, sneezing,  nasal congestion or excess/ purulent secretions,  fever, chills, sweats, unintended wt loss, pleuritic or exertional cp, hempoptysis, orthopnea pnd or leg swelling.  Also denies presyncope, palpitations, heartburn, abdominal pain, nausea, vomiting, diarrhea  or change in bowel or urinary habits, dysuria,hematuria,  rash, arthralgias, visual complaints, headache, numbness weakness or ataxia.           Objective:   Physical Exam  Anxious very hoarse wf constant coughing during interview and exam and prominent pseudowheeze  08/23/2011  Wt 145 > 09/07/2011  147   HEENT mild  turbinate edema.  Oropharynx no thrush or excess pnd or cobblestoning.  No JVD or cervical adenopathy. Mild accessory muscle hypertrophy. Trachea midline, nl thryroid. Chest was hyperinflated by percussion with diminished breath sounds and mild increased exp time without wheeze. Hoover sign positive at end inspiration. Regular rate and rhythm without murmur gallop or rub or increase P2 or edema.  Abd: no hsm, nl excursion. Ext warm without cyanosis or clubbing.     CxR ant [facility] Salvia not avail but cxr 08/12/09 c/w mild osteo/thoracic kyphosis  CT 08/19/11  No acute process     Assessment & Plan:

## 2011-09-07 NOTE — Patient Instructions (Addendum)
Stop smoking completely - it's the most important aspect of your care!  Mucinex dm 2 every every hours  Symbicort 160 Take 2 puffs first thing in am and then another 2 puffs about 12 hours later.     - ok to use Prednisone 10 mg take  4 each am x 2 days,   2 each am x 2 days,  1 each am x2days and stop if not improving on symbicort  Work on inhaler technique:  relax and gently blow all the way out then take a nice smooth deep breath back in, triggering the inhaler at same time you start breathing in.  Hold for up to 5 seconds if you can.  Rinse and gargle with water when done   If your mouth or throat starts to bother you,   I suggest you time the inhaler to your dental care and after using the inhaler(s) brush teeth and tongue with a baking soda containing toothpaste and when you rinse this out, gargle with it first to see if this helps your mouth and throat.     GERD (REFLUX)  is an extremely common cause of respiratory symptoms, many times with no significant heartburn at all.    It can be treated with medication, but also with lifestyle changes including avoidance of late meals, excessive alcohol, smoking cessation, and avoid fatty foods, chocolate, peppermint, colas, red wine, and acidic juices such as orange juice.  NO MINT OR MENTHOL PRODUCTS SO NO COUGH DROPS  USE SUGARLESS CANDY INSTEAD (jolley ranchers or Stover's)  NO OIL BASED VITAMINS - use powdered substitutes.    Please schedule a follow up office visit in 2 weeks, sooner if needed with all medications including over the counters in hand

## 2011-09-08 NOTE — Assessment & Plan Note (Addendum)
DDX of  difficult airways managment all start with A and  include Adherence, Ace Inhibitors, Acid Reflux, Active Sinus Disease, Alpha 1 Antitripsin deficiency, Anxiety masquerading as Airways dz,  ABPA,  allergy(esp in young), Aspiration (esp in elderly), Adverse effects of DPI,  Active smokers, plus two Bs  = Bronchiectasis and Beta blocker use..and one C= CHF  Adherence is always the initial "prime suspect" and is a multilayered concern that requires a "trust but verify" approach in every patient - starting with knowing how to use medications, especially inhalers, correctly, keeping up with refills and understanding the fundamental difference between maintenance and prns vs those medications only taken for a very short course and then stopped and not refilled. Having trouble with concept of med reconciliation.   To keep things simple, I have asked the patient to first separate medicines that are perceived as maintenance, that is to be taken daily "no matter what", from those medicines that are taken on only on an as-needed basis and I have given the patient examples of both, and then return to see me with all meds in hand including otcs   The proper method of use, as well as anticipated side effects, of this metered-dose inhaler are discussed and demonstrated to the patient. Improved to 50% with coaching so try symbicort 160 Take 2 puffs first thing in am and then another 2 puffs about 12 hours later but change to neb perforomist/bud if not improving  Active smoking > addressed sep  Acid reflux > reviewed rx

## 2011-09-08 NOTE — Assessment & Plan Note (Signed)
>   3 min discussion  I took an extended  opportunity with this patient to outline the consequences of continued cigarette use  in airway disorders based on all the data we have from the multiple national lung health studies (perfomed over decades at millions of dollars in cost)  indicating that smoking cessation, not choice of inhalers or physicians, is the most important aspect of care.   

## 2011-09-13 ENCOUNTER — Telehealth: Payer: Self-pay | Admitting: Internal Medicine

## 2011-09-13 MED ORDER — PANTOPRAZOLE SODIUM 40 MG PO TBEC: DELAYED_RELEASE_TABLET | ORAL | Status: DC

## 2011-09-13 NOTE — Telephone Encounter (Signed)
SPOKE WITH PT OVER 20 MINUTES EXPLAINED TO HER THAT DEXILANT WAS NOT A PERMANENT RX PER NOTE. AND SHE STATED THAT PRILOSEC DOES NOT WORK FOR HER AND IS REQUESTING ENOUGH DEXILANT UNTIL SHE CAN COME IN AND SEE YOU NEXT WEEK. DR Sherene Sires PLEASE ADVISE. THANK YOU

## 2011-09-13 NOTE — Telephone Encounter (Signed)
Per 2.11.13 ov note:  Try dexilant 30 mg x2 Take 30-60 min before first meal of the day and Pepcid 20 mg one bedtime until cough is completely gone for at least a week without the need for cough suppression  ---- Called number provided in message, was told by family member to try pt at: [phone].  LMOM TCB x1.  Dr Sherene Sires, please advise if the increase in dexilant is permanent or short-term.  I am unable to tell by pt instructions.  Thanks.

## 2011-09-13 NOTE — Telephone Encounter (Signed)
Pt returned triage's call & asked to be reached on her home number as she left previously.  [name]

## 2011-09-13 NOTE — Telephone Encounter (Signed)
We also need to resc this pt's appt for 3/15 with dr wert he is not going to be in the office

## 2011-09-13 NOTE — Telephone Encounter (Signed)
I rescheduled her appt for 09/24/11 and she is okay with trying pantoprazole and so rx was sent to pharm.

## 2011-09-13 NOTE — Telephone Encounter (Signed)
Pt was last seen 03/01 by MW pt is on Dexilant

## 2011-09-13 NOTE — Telephone Encounter (Signed)
It is not a permanent rx as it says to stop once cough is gone - if not gone can substitute prilosec until ov

## 2011-09-13 NOTE — Telephone Encounter (Signed)
Ok with me though not sure it's covered - alternative is generic protonix 40 mg Take 30-60 min before first meal of the day

## 2011-09-21 ENCOUNTER — Ambulatory Visit: Payer: Medicaid Other | Admitting: Internal Medicine

## 2011-09-24 ENCOUNTER — Ambulatory Visit: Payer: Medicaid Other | Admitting: Internal Medicine

## 2011-10-12 ENCOUNTER — Ambulatory Visit (INDEPENDENT_AMBULATORY_CARE_PROVIDER_SITE_OTHER): Payer: Medicaid Other | Admitting: Internal Medicine

## 2011-10-12 ENCOUNTER — Encounter: Payer: Self-pay | Admitting: Internal Medicine

## 2011-10-12 VITALS — BP 120/78 | HR 84 | Temp 98.1°F | Ht 59.5 in | Wt 145.6 lb

## 2011-10-12 DIAGNOSIS — F17200 Nicotine dependence, unspecified, uncomplicated: Secondary | ICD-10-CM

## 2011-10-12 DIAGNOSIS — R05 Cough: Secondary | ICD-10-CM

## 2011-10-12 MED ORDER — BUDESONIDE-FORMOTEROL FUMARATE 160-4.5 MCG/ACT IN AERO: 2.0000 | INHALATION_SPRAY | Freq: Two times a day (BID) | RESPIRATORY_TRACT | Status: DC

## 2011-10-12 NOTE — Progress Notes (Signed)
Subjective:    Patient ID: [name], female    DOB: 1971-05-08    MRN: [id number]   Brief patient profile:  40  yowf smoking used 02 tent as infant and in 4th or 5th grade then did fine thru HS cheerleading though felt breathing no quite right and onset doe onset 2012 referred 08/23/2011 by Dr Jeannetta Nap to pulmonary clinic.   HPI 08/23/2011 1st pulmonary ov actively smoking  cc sob x one year assoc with cough starting oct 2012 productive of green and yellow mucus omnicef rx ? May have helped some but did not help breathing or coughing severe to point of going to ER 2/9 no better on prednisone on this course, maybe better with ventolin last used 3 hours prior to OV . rec cycylical cough regimen Augmentin x 10 days If short of breath only use perforomist every 12 hours per neb   09/07/2011 f/u ov/[name] still smoking  Cough and sob Maybe better while on performist and worse when stopped it and no longer coughing up green stuff, only took pred a few days p ov then stopped.   rec Stop smoking completely - it's the most important aspect of your care! Mucinex dm 2 every every hours Symbicort 160 Take 2 puffs first thing in am and then another 2 puffs about 12 hours later.  - ok to use Prednisone 10 mg take  4 each am x 2 days,   2 each am x 2 days,  1 each am x2days and stop if not improving on symbicort Work on inhaler technique:   GERD diet Please schedule a follow up office visit in 2 weeks, sooner if needed with all medications including over the counters in hand.   10/12/2011 f/u ov/[name] cc Patient states cough is better since last visit. Patient also c/o chest tightness. Also states has cut back on smoking to about 3 a day.  No excess sputum or daytime need for saba, no limiting sob    Sleeping poorly with noct and   early am exacerbation  of respiratory  c/o's and  need for freq noct saba. Denies any obvious fluctuation of symptoms with weather or environmental changes or other  aggravating or alleviating factors except as outlined above.  ROS  At present neg for  any significant sore throat, dysphagia, itching, sneezing,  nasal congestion or excess/ purulent secretions,  fever, chills, sweats, unintended wt loss, pleuritic or exertional cp, hempoptysis, orthopnea pnd or leg swelling.  Also denies presyncope, palpitations, heartburn, abdominal pain, nausea, vomiting, diarrhea  or change in bowel or urinary habits, dysuria,hematuria,  rash, arthralgias, visual complaints, headache, numbness weakness or ataxia.           Objective:   Physical Exam  Pleasant amb wf, minimally hoarse, minimal throat celaring  08/23/2011  Wt 145 > 09/07/2011  147 > 10/12/2011  145  HEENT mild turbinate edema.  Oropharynx no thrush or excess pnd or cobblestoning.  No JVD or cervical adenopathy. Mild accessory muscle hypertrophy. Trachea midline, nl thryroid. Chest was hyperinflated by percussion with diminished breath sounds and mild increased exp time without wheeze. Hoover sign positive at end inspiration. Regular rate and rhythm without murmur gallop or rub or increase P2 or edema.  Abd: no hsm, nl excursion. Ext warm without cyanosis or clubbing.     CxR ant [facility] Salvia not avail but cxr 08/12/09 c/w mild osteo/thoracic kyphosis  CT 08/19/11  No acute process     Assessment & Plan:

## 2011-10-12 NOTE — Patient Instructions (Signed)
No change in your medications  Completely stop smoking before smoking completely stops you   Please schedule a follow up office visit in 6 weeks, call sooner if needed with pft's on return

## 2011-10-13 NOTE — Assessment & Plan Note (Signed)
I reviewed the Flethcher curve with patient that basically indicates  if you quit smoking when your best day FEV1 is still well preserved( as hers appears to be at present)  it is highly unlikely you will progress to severe disease and informed the patient there was no medication on the market that has proven to change the curve or the likelihood of progression.  Therefore stopping smoking and maintaining abstinence is the most important aspect of care, not choice of inhalers or for that matter, doctors.

## 2011-10-13 NOTE — Assessment & Plan Note (Signed)
Much better on present rx    Each maintenance medication was reviewed in detail including most importantly the difference between maintenance and as needed and under what circumstances the prns are to be used.  Please see instructions for details which were reviewed in writing and the patient given a copy.

## 2011-11-23 ENCOUNTER — Ambulatory Visit: Payer: Medicaid Other | Admitting: Internal Medicine

## 2011-11-29 ENCOUNTER — Ambulatory Visit (INDEPENDENT_AMBULATORY_CARE_PROVIDER_SITE_OTHER): Payer: Medicaid Other | Admitting: Internal Medicine

## 2011-11-29 ENCOUNTER — Encounter: Payer: Self-pay | Admitting: Internal Medicine

## 2011-11-29 VITALS — BP 142/86 | HR 81 | Temp 98.4°F | Ht 60.0 in | Wt 147.0 lb

## 2011-11-29 DIAGNOSIS — R0989 Other specified symptoms and signs involving the circulatory and respiratory systems: Secondary | ICD-10-CM

## 2011-11-29 DIAGNOSIS — R05 Cough: Secondary | ICD-10-CM

## 2011-11-29 DIAGNOSIS — F17200 Nicotine dependence, unspecified, uncomplicated: Secondary | ICD-10-CM

## 2011-11-29 DIAGNOSIS — J449 Chronic obstructive pulmonary disease, unspecified: Secondary | ICD-10-CM

## 2011-11-29 DIAGNOSIS — R0600 Dyspnea, unspecified: Secondary | ICD-10-CM

## 2011-11-29 LAB — PULMONARY FUNCTION TEST

## 2011-11-29 MED ORDER — ALBUTEROL SULFATE (2.5 MG/3ML) 0.083% IN NEBU: 2.5000 mg | INHALATION_SOLUTION | RESPIRATORY_TRACT | Status: DC | PRN

## 2011-11-29 NOTE — Patient Instructions (Signed)
Symbicort followed by [employer] =  Plan A, use no matter what  Plan B  If still short of breath after symbicort and tudorza then use Ventolin (albuterol)  Plan C if still short of breath ok to use nebulizer with albuterol   Prednisone 10 mg take  4 each am x 2 days,   2 each am x 2 days,  1 each am x2days and stop   Augmentin x 10 days  See Tammy NP w/in 2 weeks with all your medications, even over the counter meds, separated in two separate bags, the ones you take no matter what vs the ones you stop once you feel better and take only as needed when you feel you need them.

## 2011-11-29 NOTE — Assessment & Plan Note (Signed)
>   3 min discussion  I reviewed the Flethcher curve with patient that basically indicates  if you quit smoking when your best day FEV1 is still well preserved (which hers still is) it is highly unlikely you will progress to severe disease and informed the patient there was no medication on the market that has proven to change the curve or the likelihood of progression.  Therefore stopping smoking and maintaining abstinence is the most important aspect of care, not choice of inhalers or for that matter, doctors.

## 2011-11-29 NOTE — Progress Notes (Signed)
PFT done today. 

## 2011-11-29 NOTE — Assessment & Plan Note (Signed)
-   PFT's 11/29/2011 FEV1  1.08 (46%) ratio 43 and 15 % better p B2   - HFA 75% 11/29/2011 > added Tudorza  GOLD II/III COPD and still smoking with difficult to control symptoms  DDX of  difficult airways managment all start with A and  include Adherence, Ace Inhibitors, Acid Reflux, Active Sinus Disease, Alpha 1 Antitripsin deficiency, Anxiety masquerading as Airways dz,  ABPA,  allergy(esp in young), Aspiration (esp in elderly), Adverse effects of DPI,  Active smokers, plus two Bs  = Bronchiectasis and Beta blocker use..and one C= CHF  Adherence is always the initial "prime suspect" and is a multilayered concern that requires a "trust but verify" approach in every patient - starting with knowing how to use medications, especially inhalers, correctly, keeping up with refills and understanding the fundamental difference between maintenance and prns vs those medications only taken for a very short course and then stopped and not refilled. The proper method of use, as well as anticipated side effects, of a metered-dose inhaler are discussed and demonstrated to the patient. Improved effectiveness after extensive coaching during this visit to a level of approximately  75%  ? Acid reflux > reviewed rx ? Sinus dz > augmentin x 10 days then ct sinus if not better Active smoking > discussed separately

## 2011-11-29 NOTE — Progress Notes (Signed)
Subjective:    Patient ID: [name]    DOB: 31-Dec-1970    MRN: [id number]   Brief patient profile:  40  yowf smoker used 02 tent as infant and in 4th or 5th grade then did fine thru HS cheerleading and tol ex well then except  much worse sob  assoc with episodes of severe bronchitis then even in between episodes of  Bronchitis developed chronic doe around  2010 referred 08/23/2011 by Dr Jeannetta Nap to pulmonary clinic with GOLD II/III COPD by pft's 11/29/2011    HPI 08/23/2011 1st pulmonary ov actively smoking  cc sob x one year assoc with cough starting oct 2012 productive of green and yellow mucus omnicef rx ? May have helped some but did not help breathing or coughing severe to point of going to ER 2/9 no better on prednisone on this course, maybe better with ventolin last used 3 hours prior to OV . rec cycylical cough regimen Augmentin x 10 days If short of breath only use perforomist every 12 hours per neb   09/07/2011 f/u ov/Norell Brisbin still smoking  Cough and sob Maybe better while on performist and worse when stopped it and no longer coughing up green stuff, only took pred a few days p ov then stopped.   rec Stop smoking completely - it's the most important aspect of your care! Mucinex dm 2 every every hours Symbicort 160 Take 2 puffs first thing in am and then another 2 puffs about 12 hours later.  - ok to use Prednisone 10 mg take  4 each am x 2 days,   2 each am x 2 days,  1 each am x2days and stop if not improving on symbicort Work on inhaler technique:   GERD diet Please schedule a follow up office visit in 2 weeks, sooner if needed with all medications including over the counters in hand.   10/12/2011 f/u ov/Lasha Echeverria cc Patient states cough is better since last visit. Patient also c/o chest tightness. Also states has cut back on smoking to about 3 a day.  No excess sputum or daytime need for saba, no limiting sob rec No change in your medications Completely stop smoking before  smoking completely stops you  Please schedule a follow up office visit in 6 weeks, call sooner if needed with pft's on return   11/29/2011 f/u ov/Brennden Masten  Still smoking cc much worse sob since April 20th severe cough >  Green mucus > amoxillicin and prednisone and cough syrup > no improvement at all.  AM of ov woke up at nl hour on day of ov and held all meds until ov for pft's. Does feel some better with albuterol hfa, no longer has neb albuterol.  No overt sinus or hb symptoms    Sleeping poorly with noct and   early am exacerbation  of respiratory  c/o's and  need for freq noct saba. Denies any obvious fluctuation of symptoms with weather or environmental changes or other aggravating or alleviating factors except as outlined above.  ROS  At present neg for  any significant sore throat, dysphagia, itching, sneezing,  nasal congestion or excess/ purulent secretions,  fever, chills, sweats, unintended wt loss, pleuritic or exertional cp, hempoptysis, orthopnea pnd or leg swelling.  Also denies presyncope, palpitations, heartburn, abdominal pain, nausea, vomiting, diarrhea  or change in bowel or urinary habits, dysuria,hematuria,  rash, arthralgias, visual complaints, headache, numbness weakness or ataxia.           Objective:  Physical Exam  Uncomfortable appearing amb wf, minimally hoarse, minimal throat celaring/ hacking cough  08/23/2011  Wt 145 > 09/07/2011  147 > 10/12/2011  145 > 11/29/2011  147  HEENT mild turbinate edema.  Oropharynx no thrush or excess pnd or cobblestoning.  No JVD or cervical adenopathy. Mild accessory muscle hypertrophy. Trachea midline, nl thryroid. Chest was hyperinflated by percussion with diminished breath sounds and mild increased exp time without wheeze. Hoover sign positive at end inspiration. Regular rate and rhythm without murmur gallop or rub or increase P2 or edema.  Abd: no hsm, nl excursion. Ext warm without cyanosis or clubbing.     CxR ant [facility] not  avail but cxr 08/12/09 c/w mild osteo/thoracic kyphosis  CT 08/19/11  No acute process     Assessment & Plan:

## 2011-11-30 ENCOUNTER — Telehealth: Payer: Self-pay | Admitting: Internal Medicine

## 2011-11-30 MED ORDER — PREDNISONE 10 MG PO TABS: ORAL_TABLET | ORAL | Status: DC

## 2011-11-30 MED ORDER — AMOXICILLIN-POT CLAVULANATE 875-125 MG PO TABS: 1.0000 | ORAL_TABLET | Freq: Two times a day (BID) | ORAL | Status: AC

## 2011-11-30 NOTE — Telephone Encounter (Signed)
Pt is aware. rx's have been sent to the pharmacy. 

## 2011-11-30 NOTE — Telephone Encounter (Signed)
augmentin 875 bid x 10 days  

## 2011-11-30 NOTE — Telephone Encounter (Signed)
Dr. Sherene Sires, I went ahead and sent the pred taper. Please advise directions for augmentin and then will send this, thanks!

## 2011-12-04 ENCOUNTER — Encounter: Payer: Self-pay | Admitting: Internal Medicine

## 2011-12-19 ENCOUNTER — Ambulatory Visit (INDEPENDENT_AMBULATORY_CARE_PROVIDER_SITE_OTHER): Payer: Medicaid Other | Admitting: Adult Health

## 2011-12-19 DIAGNOSIS — J449 Chronic obstructive pulmonary disease, unspecified: Secondary | ICD-10-CM

## 2011-12-19 NOTE — Progress Notes (Signed)
Rescheduled per pt for med calendar  No complaints today .

## 2011-12-19 NOTE — Assessment & Plan Note (Signed)
Rescheduled

## 2011-12-31 ENCOUNTER — Encounter: Payer: Medicaid Other | Admitting: Adult Health

## 2012-01-04 ENCOUNTER — Telehealth: Payer: Self-pay | Admitting: Internal Medicine

## 2012-01-04 NOTE — Telephone Encounter (Signed)
I spoke with pt and she states she has misplaced her symbicort inhaler and can;t find it anywhere. She can;t afford to pay out of pocket for early refill. She is requesting a sample. I advised will leave sample upfront for pick up. Nothing further was needed

## 2012-01-15 ENCOUNTER — Encounter: Payer: Medicaid Other | Admitting: Adult Health

## 2012-01-24 ENCOUNTER — Encounter: Payer: Self-pay | Admitting: Adult Health

## 2012-01-24 ENCOUNTER — Ambulatory Visit (INDEPENDENT_AMBULATORY_CARE_PROVIDER_SITE_OTHER): Payer: Medicaid Other | Admitting: Adult Health

## 2012-01-24 VITALS — BP 116/72 | HR 94 | Temp 97.7°F | Ht 59.5 in | Wt 149.2 lb

## 2012-01-24 DIAGNOSIS — J449 Chronic obstructive pulmonary disease, unspecified: Secondary | ICD-10-CM

## 2012-01-24 MED ORDER — AMOXICILLIN-POT CLAVULANATE 875-125 MG PO TABS: 1.0000 | ORAL_TABLET | Freq: Two times a day (BID) | ORAL | Status: AC

## 2012-01-24 MED ORDER — CLOTRIMAZOLE 10 MG MT TROC: 10.0000 mg | Freq: Every day | OROMUCOSAL | Status: DC

## 2012-01-24 NOTE — Assessment & Plan Note (Addendum)
COPD flare complicated by thrush  Patient's medications were reviewed today and patient education was given. Computerized medication calendar was adjusted/completed   Plan  Restart Tudorza 1 puff Twice daily   Brush /rinse and gargle after use  May use Armour/hammer baking soda/peroxide toothpaste  Mycelex troches five times daily for 1 week for thrush Augmentin 875mg  Twice daily  For 7 days  Mucinex DM Twice daily  As needed  Cough/congestion  Follow med calendar closely and bring to each visit  follow up Dr. Sherene Sires  In 6 weeks and As needed

## 2012-01-24 NOTE — Progress Notes (Signed)
Subjective:    Patient ID: [name]    DOB: 1970-12-15    MRN: [id number]   Brief patient profile:  40  yowf smoker used 02 tent as infant and in 4th or 5th grade then did fine thru HS cheerleading and tol ex well then except  much worse sob  assoc with episodes of severe bronchitis then even in between episodes of  Bronchitis developed chronic doe around  2010 referred 08/23/2011 by Dr Jeannetta Nap to pulmonary clinic with GOLD II/III COPD by pft's 11/29/2011    HPI 08/23/2011 1st pulmonary ov actively smoking  cc sob x one year assoc with cough starting oct 2012 productive of green and yellow mucus omnicef rx ? May have helped some but did not help breathing or coughing severe to point of going to ER 2/9 no better on prednisone on this course, maybe better with ventolin last used 3 hours prior to OV . rec cycylical cough regimen Augmentin x 10 days If short of breath only use perforomist every 12 hours per neb   09/07/2011 f/u ov/Wert still smoking  Cough and sob Maybe better while on performist and worse when stopped it and no longer coughing up green stuff, only took pred a few days p ov then stopped.   rec Stop smoking completely - it's the most important aspect of your care! Mucinex dm 2 every every hours Symbicort 160 Take 2 puffs first thing in am and then another 2 puffs about 12 hours later.  - ok to use Prednisone 10 mg take  4 each am x 2 days,   2 each am x 2 days,  1 each am x2days and stop if not improving on symbicort Work on inhaler technique:   GERD diet Please schedule a follow up office visit in 2 weeks, sooner if needed with all medications including over the counters in hand.   10/12/2011 f/u ov/Wert cc Patient states cough is better since last visit. Patient also c/o chest tightness. Also states has cut back on smoking to about 3 a day.  No excess sputum or daytime need for saba, no limiting sob rec No change in your medications Completely stop smoking before  smoking completely stops you  Please schedule a follow up office visit in 6 weeks, call sooner if needed with pft's on return   11/29/2011 f/u ov/Wert  Still smoking cc much worse sob since April 20th severe cough >  Green mucus > amoxillicin and prednisone and cough syrup > no improvement at all.  AM of ov woke up at nl hour on day of ov and held all meds until ov for pft's. Does feel some better with albuterol hfa, no longer has neb albuterol.  No overt sinus or hb symptoms >>pred taper    01/24/2012 Follow up and med review  patient returns for a 6 week  followup for COPD She is brought all her medications and her review We reviewed all her medications and organized them into a medication calendar with patient education She has ran out [place] the inhaler Feels that she continues to have shortness of breath with activity  no increased cough or wheezing    ROS:  Constitutional:   No  weight loss, night sweats,  Fevers, chills,  +fatigue, or  lassitude.  HEENT:   No headaches,  Difficulty swallowing,  Tooth/dental problems, or  Sore throat,                No sneezing, itching,  ear ache,  +nasal congestion, post nasal drip,   CV:  No chest pain,  Orthopnea, PND, swelling in lower extremities, anasarca, dizziness, palpitations, syncope.   GI  No heartburn, indigestion, abdominal pain, nausea, vomiting, diarrhea, change in bowel habits, loss of appetite, bloody stools.   Resp:   No coughing up of blood.    No chest wall deformity  Skin: no rash or lesions.  GU: no dysuria, change in color of urine, no urgency or frequency.  No flank pain, no hematuria   MS:  No joint pain or swelling.  No decreased range of motion.  No back pain.  Psych:  No change in mood or affect. No depression or anxiety.  No memory loss.                Objective:   Physical Exam   08/23/2011  Wt 145 > 09/07/2011  147 > 10/12/2011  145 > 11/29/2011  147 > 149 01/24/2012   HEENT mild turbinate edema.   Oropharynx with scattered white patches c/w thrush       NECK:  Supple w/ fair ROM; no JVD; normal carotid impulses w/o bruits; no thyromegaly or nodules palpated; no lymphadenopathy.  RESP  Coarse BS  w/o, wheezes/ rales/ or rhonchi.no accessory muscle use, no dullness to percussion  CARD:  RRR, no m/r/g  , no peripheral edema, pulses intact, no cyanosis or clubbing.  GI:   Soft & nt; nml bowel sounds; no organomegaly or masses detected.  Musco: Warm bil, no deformities or joint swelling noted.   Neuro: alert, no focal deficits noted.    Skin: Warm, no lesions or rashes   CT 08/19/11  No acute process     Assessment & Plan:

## 2012-01-24 NOTE — Patient Instructions (Addendum)
Restart Tudorza 1 puff Twice daily   Brush /rinse and gargle after use  May use Armour/hammer baking soda/peroxide toothpaste  Mycelex troches five times daily for 1 week for thrush Augmentin 875mg  Twice daily  For 7 days  Mucinex DM Twice daily  As needed  Cough/congestion  Follow med calendar closely and bring to each visit  follow up Dr. Sherene Sires  In 6 weeks and As needed

## 2012-01-25 ENCOUNTER — Telehealth: Payer: Self-pay | Admitting: Adult Health

## 2012-01-25 MED ORDER — ACLIDINIUM BROMIDE 400 MCG/ACT IN AEPB: 1.0000 | INHALATION_SPRAY | Freq: Two times a day (BID) | RESPIRATORY_TRACT | Status: DC

## 2012-01-25 MED ORDER — FLUCONAZOLE 100 MG PO TABS: ORAL_TABLET | ORAL | Status: DC

## 2012-01-25 NOTE — Telephone Encounter (Signed)
Pt was given mycelex troche at med calendar ov yesterday.  Per TP: ok for diflucan 100mg  #8, 2 today then 1 daily until gone.  Thanks.  Called spoke with patient, informed her of the change as stated above by TP.  Pt verbalized her understanding.  Pt also stated she did not receive her tudorza rx from yesterday's ov.  Will send that as well.  rx's sent to verified pharmacy.  Pt to call if anything further is needed.

## 2012-01-25 NOTE — Addendum Note (Signed)
Addended by: Boone Master E on: 01/25/2012 01:07 PM   Modules accepted: Orders

## 2012-03-05 ENCOUNTER — Telehealth: Payer: Self-pay | Admitting: *Deleted

## 2012-03-05 NOTE — Telephone Encounter (Signed)
Prior Auth received from [facility] for [employer] Inhaler- 1 puff bid x5RF PA [phone] Medicaid ID [id number]  Per PA company, pt needs to have tried and/or failed atleast TWO of the following medications: Albuterol Neb Combivent Duo-Neb Ipratropium Spiriva Atrovent  The patient is currently taking the Albuterol Neb.  What other medication options would you like to try for the patient seeing as to how this medication has been denied for coverage?  Please advise Tammy, Thanks.   Allergies  Allergen Reactions  . Cymbalta (Duloxetine Hcl) Nausea And Vomiting  . Pregabalin Swelling

## 2012-03-06 ENCOUNTER — Encounter: Payer: Medicaid Other | Admitting: Internal Medicine

## 2012-03-06 NOTE — Progress Notes (Signed)
 This encounter was created in error - please disregard.

## 2012-03-06 NOTE — Telephone Encounter (Signed)
Per 11-29-11 ov with MW:  Patient Instructions     Symbicort followed by [employer] = Plan A, use no matter what  Plan B If still short of breath after symbicort and tudorza then use Ventolin (albuterol)  Plan C if still short of breath ok to use nebulizer with albuterol   --------------------------------------------- Dr Sherene Sires, pt was put on the Tudorza at the 11-29-11 ov w/ you.  She is currently taking Symbicort 160-4.16mcg as well and has albuterol neb soln PRN.  Pt's [employer] is being denied - may she be changed to:  combivent Duo-neb Ipratropium/atrovent spiriva  Thanks!

## 2012-03-06 NOTE — Telephone Encounter (Signed)
Change to spriva unless she's failed it previously

## 2012-03-07 MED ORDER — TIOTROPIUM BROMIDE MONOHYDRATE 18 MCG IN CAPS: 18.0000 ug | ORAL_CAPSULE | Freq: Every day | RESPIRATORY_TRACT | Status: DC

## 2012-03-07 NOTE — Telephone Encounter (Signed)
Called, spoke with pt.  She is aware of insurance not covering [place].  She has not tried spiriva in the past.  Advised, I will send spriva rx into pharmacy and instructed her on how to use this inhaler.  Advised this will be used once daily.  Also, asked she pls have pharmacist show her how to use this and call back if any questions.  She verbalized understanding of this.    Also, reports increased SOB and prod cough with green mucus.  No wheezing or chest tightness. Was seen by PCP on Tuesday and given an abx and cough syrup.  Pt staes she is getting better.  Using albuterol neb with relief for SOB.  I offered OV today.  She already has an OV scheduled with TP on Tuesday, Sept 3 -- requesting to keep this appt for now.  Advised to pls call back if symptoms worsen or seek emergency care if needed.  She verbalized understanding of this and voiced no further questions/concerns at this time.

## 2012-03-11 ENCOUNTER — Encounter: Payer: Self-pay | Admitting: Adult Health

## 2012-03-11 ENCOUNTER — Ambulatory Visit (INDEPENDENT_AMBULATORY_CARE_PROVIDER_SITE_OTHER): Payer: Medicaid Other | Admitting: Adult Health

## 2012-03-11 VITALS — BP 110/80 | HR 78 | Temp 98.7°F | Ht <= 58 in | Wt 144.0 lb

## 2012-03-11 DIAGNOSIS — J449 Chronic obstructive pulmonary disease, unspecified: Secondary | ICD-10-CM

## 2012-03-11 MED ORDER — PANTOPRAZOLE SODIUM 40 MG PO TBEC: DELAYED_RELEASE_TABLET | ORAL | Status: DC

## 2012-03-11 NOTE — Patient Instructions (Addendum)
Finish Augmentin as directed.  Continue on Spiriva and Symbicort   follow up Dr. Sherene Sires  In 6 weeks and As needed

## 2012-03-11 NOTE — Progress Notes (Signed)
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 May have helped some but did not help breathing or coughing severe to point of going to ER 2/9 no better on prednisone on this course, maybe better with ventolin last used 3 hours prior to OV . rec cycylical cough regimen Augmentin x 10 days If short of breath only use perforomist every 12 hours per neb   09/07/2011 f/u ov/Wert still smoking  Cough and sob Maybe better while on performist and worse when stopped it and no longer coughing up green stuff, only took pred a few days p ov then stopped.   rec Stop smoking completely - it's the most important aspect of your care! Mucinex dm 2 every every hours Symbicort 160 Take 2 puffs first thing in am and then another 2 puffs about 12 hours later.  - ok to use Prednisone 10 mg take  4 each am x 2 days,   2 each am x 2 days,  1 each am x2days and stop if not improving on symbicort Work on inhaler technique:   GERD diet Please schedule a follow up office visit in 2 weeks, sooner if needed with all medications including over the counters in hand.   10/12/2011 f/u ov/Wert cc Patient states cough is better since last visit. Patient also c/o chest tightness. Also states has cut back on smoking to about 3 a day.  No excess sputum or daytime need for saba, no limiting sob rec No change in your medications Completely stop smoking before  smoking completely stops you  Please schedule a follow up office visit in 6 weeks, call sooner if needed with pft's on return   11/29/2011 f/u ov/Wert  Still smoking cc much worse sob since April 20th severe cough >  Green mucus > amoxillicin and prednisone and cough syrup > no improvement at all.  AM of ov woke up at nl hour on day of ov and held all meds until ov for pft's. Does feel some better with albuterol hfa, no longer has neb albuterol.  No overt sinus or hb symptoms >>pred taper    01/24/2012 Follow up and med review  patient returns for a 6 week  followup for COPD She is brought all her medications and her review We reviewed all her medications and organized them into a medication calendar with patient education She has ran out [place] the inhaler Feels that she continues to have shortness of breath with activity  no increased cough or wheezing rec Restart Tudorza 1 puff Twice daily   Brush /rinse and gargle after use  May use Armour/hammer baking soda/peroxide toothpaste  Mycelex troches five times daily for 1 week for thrush Augmentin 875mg  Twice daily  For 7 days  Mucinex DM Twice daily  As needed  Cough/congestion  Follow med calendar closely  and bring to each visi   03/11/2012 Follow up COPD  Returns for 1 month follow up  Got some better but flared again , seen by PCP rx  augmentin and codeine cough syrup-for cough w green mucus last week--feels like she had since improved --spiriva inhaler started by Dr. Sherene Sires  d/t insurance would not cover tudorza. Has not started spiriva yet.  Feeling better has few days of augmentin left.  No fever or edema  Still DOE and cough on/off .    Marland Kitchen ROS:  Constitutional:   No  weight loss, night sweats,  Fevers, chills,  +fatigue, or  lassitude.  HEENT:   No headaches,  Difficulty swallowing,  Tooth/dental problems, or  Sore throat,                No sneezing, itching, ear ache,  +nasal congestion, post nasal drip,   CV:  No chest  pain,  Orthopnea, PND, swelling in lower extremities, anasarca, dizziness, palpitations, syncope.   GI  No heartburn, indigestion, abdominal pain, nausea, vomiting, diarrhea, change in bowel habits, loss of appetite, bloody stools.   Resp:    No coughing up of blood. No chest wall deformity  Skin: no rash or lesions.  GU: no dysuria, change in color of urine, no urgency or frequency.  No flank pain, no hematuria   MS:  No joint pain or swelling.  No decreased range of motion.  No back pain.  Psych:  No change in mood or affect. No depression or anxiety.  No memory loss.               Objective:   Physical Exam   08/23/2011  Wt 145 > 09/07/2011  147 > 10/12/2011  145 > 11/29/2011  147 > 149 01/24/2012 >  144 03/11/2012  HEENT mild turbinate edema.  Oropharynx clear     NECK:  Supple w/ fair ROM; no JVD; normal carotid impulses w/o bruits; no thyromegaly or nodules palpated; no lymphadenopathy.  RESP  Coarse BS  w/o, wheezes/ rales/ or rhonchi.no accessory muscle use, no dullness to percussion  CARD:  RRR, no m/r/g  , no peripheral edema, pulses intact, no cyanosis or clubbing.  GI:   Soft & nt; nml bowel sounds; no organomegaly or masses detected.  Musco: Warm bil, no deformities or joint swelling noted.   Neuro: alert, no focal deficits noted.    Skin: Warm, no lesions or rashes   CT 08/19/11  No acute process     Assessment & Plan:

## 2012-03-11 NOTE — Assessment & Plan Note (Addendum)
Flare with slow to resolve   Plan Finish Augmentin as directed.  Continue on Spiriva and Symbicort   follow up Dr. Sherene Sires  In 6 weeks and As needed

## 2012-03-11 NOTE — Addendum Note (Signed)
Addended by: Boone Master E on: 03/11/2012 04:57 PM   Modules accepted: Orders

## 2012-03-26 ENCOUNTER — Telehealth: Payer: Self-pay | Admitting: Internal Medicine

## 2012-03-26 NOTE — Telephone Encounter (Signed)
LMTCB

## 2012-03-27 MED ORDER — IPRATROPIUM BROMIDE HFA 17 MCG/ACT IN AERS: 2.0000 | INHALATION_SPRAY | Freq: Four times a day (QID) | RESPIRATORY_TRACT | Status: DC

## 2012-03-27 NOTE — Telephone Encounter (Signed)
Lawson Fiscal, have you've done a PA on pt tudorza. Please advise if not thanks

## 2012-03-27 NOTE — Telephone Encounter (Signed)
So does she want [place] to send prior auth to medicaid  For [place]

## 2012-03-27 NOTE — Telephone Encounter (Signed)
Tammy, I spoke with the pt. She states that she is already on spiriva, has been taking for almost 1 month and it is not helping. She has never tried atrovent, and this was the other preferred med. Do you want [place] to call this in for her? Please advise, thanks!!

## 2012-03-27 NOTE — Telephone Encounter (Signed)
Spoke with pt and notified of recs per TP. Pt verbalized understanding. Rx was sent to pharm.

## 2012-03-27 NOTE — Telephone Encounter (Signed)
spiriva 1 puff daily #1  5 refills Let pt know

## 2012-03-27 NOTE — Telephone Encounter (Signed)
lmomtcb x1 for pt 

## 2012-03-27 NOTE — Telephone Encounter (Signed)
Spoke with Kennyth Arnold at [facility] and she is faxing over PA request form to the triage fax.

## 2012-03-27 NOTE — Telephone Encounter (Signed)
Medicaid [id number]. Member  JW#[id number] L.Pt must first try Spiriva or Atrovent. TP pls advise. Allergies  Allergen Reactions  . Cymbalta (Duloxetine Hcl) Nausea And Vomiting  . Pregabalin Swelling

## 2012-03-27 NOTE — Telephone Encounter (Signed)
Pt is willing to try Atrovent, is this an appropriate alternative? Pls advise.

## 2012-03-27 NOTE — Telephone Encounter (Signed)
Please call pt back she was on the other phone

## 2012-03-27 NOTE — Telephone Encounter (Signed)
LMTCB

## 2012-03-27 NOTE — Telephone Encounter (Signed)
LMOMTCB x 1 

## 2012-03-27 NOTE — Telephone Encounter (Signed)
Yes but it is 2 puffs Four times a day   Or neb Four times a day   That is fine if she wants it.

## 2012-04-23 ENCOUNTER — Encounter: Payer: Self-pay | Admitting: Gastroenterology

## 2012-04-23 ENCOUNTER — Ambulatory Visit (INDEPENDENT_AMBULATORY_CARE_PROVIDER_SITE_OTHER): Payer: Medicaid Other | Admitting: Internal Medicine

## 2012-04-23 ENCOUNTER — Encounter: Payer: Self-pay | Admitting: Internal Medicine

## 2012-04-23 VITALS — BP 128/82 | HR 93 | Temp 98.5°F | Ht 58.5 in | Wt 142.0 lb

## 2012-04-23 DIAGNOSIS — K219 Gastro-esophageal reflux disease without esophagitis: Secondary | ICD-10-CM | POA: Insufficient documentation

## 2012-04-23 DIAGNOSIS — F17200 Nicotine dependence, unspecified, uncomplicated: Secondary | ICD-10-CM

## 2012-04-23 DIAGNOSIS — Z23 Encounter for immunization: Secondary | ICD-10-CM

## 2012-04-23 DIAGNOSIS — J449 Chronic obstructive pulmonary disease, unspecified: Secondary | ICD-10-CM

## 2012-04-23 MED ORDER — ACLIDINIUM BROMIDE 400 MCG/ACT IN AEPB: 1.0000 | INHALATION_SPRAY | Freq: Two times a day (BID) | RESPIRATORY_TRACT | Status: DC

## 2012-04-23 NOTE — Progress Notes (Signed)
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 May have helped some but did not help breathing or coughing severe to point of going to ER 2/9 no better on prednisone on this course, maybe better with ventolin last used 3 hours prior to OV . rec cycylical cough regimen Augmentin x 10 days If short of breath only use perforomist every 12 hours per neb   09/07/2011 f/u ov/[name] still smoking  Cough and sob Maybe better while on performist and worse when stopped it and no longer coughing up green stuff, only took pred a few days p ov then stopped.   rec Stop smoking completely - it's the most important aspect of your care! Mucinex dm 2 every every hours Symbicort 160 Take 2 puffs first thing in am and then another 2 puffs about 12 hours later.  - ok to use Prednisone 10 mg take  4 each am x 2 days,   2 each am x 2 days,  1 each am x2days and stop if not improving on symbicort Work on inhaler technique:   GERD diet Please schedule a follow up office visit in 2 weeks, sooner if needed with all medications including over the counters in hand.   10/12/2011 f/u ov/[name] cc Patient states cough is better since last visit. Patient also c/o chest tightness. Also states has cut back on smoking to about 3 a day.  No excess sputum or daytime need for saba, no limiting sob rec No change in your medications Completely stop smoking before  smoking completely stops you  Please schedule a follow up office visit in 6 weeks, call sooner if needed with pft's on return   11/29/2011 f/u ov/[name]  Still smoking cc much worse sob since April 20th severe cough >  Green mucus > amoxillicin and prednisone and cough syrup > no improvement at all.  AM of ov woke up at nl hour on day of ov and held all meds until ov for pft's. Does feel some better with albuterol hfa, no longer has neb albuterol.  No overt sinus or hb symptoms >>pred taper    01/24/2012 Follow up and med review  patient returns for a 6 week  followup for COPD She is brought all her medications and her review We reviewed all her medications and organized them into a medication calendar with patient education She has ran out [place] the inhaler Feels that she continues to have shortness of breath with activity  no increased cough or wheezing rec Restart Tudorza 1 puff Twice daily   Brush /rinse and gargle after use  May use Armour/hammer baking soda/peroxide toothpaste  Mycelex troches five times daily for 1 week for thrush Augmentin 875mg  Twice daily  For 7 days  Mucinex DM Twice daily  As needed  Cough/congestion  Follow med calendar closely  and bring to each visi   03/11/2012 Follow up COPD  Returns for 1 month follow up  Got some better but flared again , seen by PCP rx  augmentin and codeine cough syrup-for cough w green mucus last week--feels like she had since improved --spiriva inhaler started by Dr. Sherene Sires  d/t insurance would not cover tudorza. Has not started spiriva yet.  Feeling better has few days of augmentin left.  No fever or edema  Still DOE and cough on/off .  rec Finish Augmentin as directed.  Continue on Spiriva and Symbicort > insurance required change  spiriva to atrovent  04/23/2012 f/u ov/[name]  Still smoking 2-3 cigs per day on symbicort/atrovent but tudorza has been approved and thinks it worked better.  Doe x one flight,   No obvious daytime  variabilty or assoc chronic cough or cp or chest tightness, subjective wheeze overt sinus  symptoms. No unusual exp hx. Bad hb  Sleeping ok without nocturnal  or early am exacerbation  of respiratory  c/o's or need for noct saba. Also denies any obvious fluctuation of symptoms with weather or environmental changes or other aggravating or alleviating factors except as outlined above   ROS  The following are not active complaints unless bolded sore throat, dysphagia, dental problems, itching, sneezing,  nasal congestion or excess/ purulent secretions, ear ache,   fever, chills, sweats, unintended wt loss, pleuritic or exertional cp, hemoptysis,  orthopnea pnd or leg swelling, presyncope, palpitations, heartburn, abdominal pain, anorexia, nausea, vomiting, diarrhea  or change in bowel or urinary habits, change in stools or urine, dysuria,hematuria,  rash, arthralgias, visual complaints, headache, numbness weakness or ataxia or problems with walking or coordination,  change in mood/affect or memory.      .  .                 Objective:   Physical Exam   08/23/2011  Wt 145 >   149 01/24/2012 >  144 03/11/2012 > 04/23/2012  142   HEENT mild turbinate edema.  Oropharynx clear     NECK:  Supple w/ fair ROM; no JVD; normal carotid impulses w/o bruits; no thyromegaly or nodules palpated; no lymphadenopathy.  RESP  slt distant BS  w/o, wheezes/ rales/ or rhonchi.no accessory muscle use, no dullness to percussion  CARD:  RRR, no m/r/g  , no peripheral edema, pulses intact, no cyanosis or clubbing.  GI:   Soft & nt; nml bowel sounds; no organomegaly or masses detected.  Musco: Warm bil, no deformities or joint swelling noted.   Neuro: alert, no focal deficits noted.    Skin: Warm, no lesions or rashes   CT 08/19/11  No acute process     Assessment & Plan:

## 2012-04-23 NOTE — Patient Instructions (Addendum)
Please see patient coordinator before you leave today  to schedule GI eval for bad heartburn  Restart [name] one twice a day  The key is to stop smoking completely before smoking completely stops you!   GERD (REFLUX)  is an extremely common cause of respiratory symptoms, many times with no significant heartburn at all.    It can be treated with medication, but also with lifestyle changes including avoidance of late meals, excessive alcohol, smoking cessation, and avoid fatty foods, chocolate, peppermint, colas, red wine, and acidic juices such as orange juice.  NO MINT OR MENTHOL PRODUCTS SO NO COUGH DROPS  USE SUGARLESS CANDY INSTEAD (jolley ranchers or Stover's)  NO OIL BASED VITAMINS - use powdered substitutes.  See calendar for specific medication instructions and bring it back for each and every office visit for every healthcare provider you see.  Without it,  you may not receive the best quality medical care that we feel you deserve.  You will note that the calendar groups together  your maintenance  medications that are timed at particular times of the day.  Think of this as your checklist for what your doctor has instructed you to do until your next evaluation to see what benefit  there is  to staying on a consistent group of medications intended to keep you well.  The other group at the bottom is entirely up to you to use as you see fit  for specific symptoms that may arise between visits that require you to treat them on an as needed basis.  Think of this as your action plan or "what if" list.   Separating the top medications from the bottom group is fundamental to providing you adequate care going forward.    Please schedule a follow up visit in 3 months but call sooner if needed   Late add: needs alpha one AT  [facility] next ov

## 2012-04-25 NOTE — Assessment & Plan Note (Signed)
Referred to [facility] >  Diet/ smoking cessation and max rx discussed    Each maintenance medication was reviewed in detail including most importantly the difference between maintenance and as needed and under what circumstances the prns are to be used.  Please see instructions for details which were reviewed in writing and the patient given a copy.

## 2012-04-25 NOTE — Assessment & Plan Note (Signed)
I took an extended  opportunity with this patient to outline the consequences of continued cigarette use  in airway disorders based on all the data we have from the multiple national lung health studies (perfomed over decades at millions of dollars in cost)  indicating that smoking cessation, not choice of inhalers or physicians, is the most important aspect of care.   

## 2012-04-25 NOTE — Assessment & Plan Note (Addendum)
-   PFT's 11/29/2011 FEV1  1.08 (46%) ratio 43 and 15 % better p B2   - HFA 75% 11/29/2011 > added Tudorza>changed to spiriva /insurance > changed back to tudorza 04/23/2012    - HFA 90% 04/23/2012 p extensive coaching.  GOLD III and predominantly fixed pattern at this point suggesting asthmatic component controlled so adding back tudorza makes the most sense

## 2012-05-16 ENCOUNTER — Ambulatory Visit: Payer: Medicaid Other | Admitting: Gastroenterology

## 2012-07-23 ENCOUNTER — Ambulatory Visit: Payer: Medicaid Other | Admitting: Internal Medicine

## 2012-07-31 ENCOUNTER — Ambulatory Visit: Payer: Medicaid Other | Admitting: Internal Medicine

## 2012-09-24 ENCOUNTER — Other Ambulatory Visit: Payer: Self-pay | Admitting: Internal Medicine

## 2013-03-16 ENCOUNTER — Ambulatory Visit (INDEPENDENT_AMBULATORY_CARE_PROVIDER_SITE_OTHER): Payer: Medicaid Other | Admitting: Internal Medicine

## 2013-03-16 ENCOUNTER — Encounter: Payer: Self-pay | Admitting: Internal Medicine

## 2013-03-16 ENCOUNTER — Telehealth: Payer: Self-pay | Admitting: Internal Medicine

## 2013-03-16 VITALS — BP 112/70 | HR 98 | Temp 98.2°F | Ht <= 58 in | Wt 130.2 lb

## 2013-03-16 DIAGNOSIS — F17200 Nicotine dependence, unspecified, uncomplicated: Secondary | ICD-10-CM

## 2013-03-16 DIAGNOSIS — J449 Chronic obstructive pulmonary disease, unspecified: Secondary | ICD-10-CM

## 2013-03-16 DIAGNOSIS — I10 Essential (primary) hypertension: Secondary | ICD-10-CM

## 2013-03-16 MED ORDER — ACLIDINIUM BROMIDE 400 MCG/ACT IN AEPB: 1.0000 | INHALATION_SPRAY | Freq: Two times a day (BID) | RESPIRATORY_TRACT | Status: DC

## 2013-03-16 MED ORDER — IRBESARTAN 150 MG PO TABS: 150.0000 mg | ORAL_TABLET | Freq: Every day | ORAL | Status: DC

## 2013-03-16 MED ORDER — VALSARTAN 160 MG PO TABS: 160.0000 mg | ORAL_TABLET | Freq: Every day | ORAL | Status: DC

## 2013-03-16 MED ORDER — FAMOTIDINE 20 MG PO TABS: ORAL_TABLET | ORAL | Status: DC

## 2013-03-16 MED ORDER — BUDESONIDE-FORMOTEROL FUMARATE 160-4.5 MCG/ACT IN AERO: INHALATION_SPRAY | RESPIRATORY_TRACT | Status: DC

## 2013-03-16 MED ORDER — TRAMADOL HCL 50 MG PO TABS: ORAL_TABLET | ORAL | Status: DC

## 2013-03-16 MED ORDER — DEXLANSOPRAZOLE 60 MG PO CPDR: DELAYED_RELEASE_CAPSULE | ORAL | Status: DC

## 2013-03-16 NOTE — Telephone Encounter (Signed)
diovan 160 mg one daily x 30x   11 refill s

## 2013-03-16 NOTE — Telephone Encounter (Signed)
I spoke with [facility]. Was advised the avapro 150 mg is not covered by medicaid. The preferred is diovan or losartan. Please advise MW thanks  Allergies  Allergen Reactions  . Cymbalta [Duloxetine Hcl] Nausea And Vomiting  . Pregabalin Swelling

## 2013-03-16 NOTE — Progress Notes (Signed)
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 May have helped some but did not help breathing or coughing severe to point of going to ER 2/9 no better on prednisone on this course, maybe better with ventolin last used 3 hours prior to OV . rec cycylical cough regimen Augmentin x 10 days If short of breath only use perforomist every 12 hours per neb   09/07/2011 f/u ov/Rayner Erman still smoking  Cough and sob Maybe better while on performist and worse when stopped it and no longer coughing up green stuff, only took pred a few days p ov then stopped.   rec Stop smoking completely - it's the most important aspect of your care! Mucinex dm 2 every every hours Symbicort 160 Take 2 puffs first thing in am and then another 2 puffs about 12 hours later.  - ok to use Prednisone 10 mg take  4 each am x 2 days,   2 each am x 2 days,  1 each am x2days and stop if not improving on symbicort Work on inhaler technique:   GERD diet Please schedule a follow up office visit in 2 weeks, sooner if needed with all medications including over the counters in hand.   10/12/2011 f/u ov/Edona Schreffler cc Patient states cough is better since last visit. Patient also c/o chest tightness. Also states has cut back on smoking to about 3 a day.  No excess sputum or daytime need for saba, no limiting sob rec No change in your medications Completely stop smoking before  smoking completely stops you  Please schedule a follow up office visit in 6 weeks, call sooner if needed with pft's on return   11/29/2011 f/u ov/Sheneka Schrom  Still smoking cc much worse sob since April 20th severe cough >  Green mucus > amoxillicin and prednisone and cough syrup > no improvement at all.  AM of ov woke up at nl hour on day of ov and held all meds until ov for pft's. Does feel some better with albuterol hfa, no longer has neb albuterol.  No overt sinus or hb symptoms >>pred taper    01/24/2012 Follow up and med review  patient returns for a 6 week  followup for COPD She is brought all her medications and her review We reviewed all her medications and organized them into a medication calendar with patient education She has ran out [place] the inhaler Feels that she continues to have shortness of breath with activity  no increased cough or wheezing rec Restart Tudorza 1 puff Twice daily   Brush /rinse and gargle after use  May use Armour/hammer baking soda/peroxide toothpaste  Mycelex troches five times daily for 1 week for thrush Augmentin 875mg  Twice daily  For 7 days  Mucinex DM Twice daily  As needed  Cough/congestion  Follow med calendar closely  and bring to each visi   03/11/2012 Follow up COPD  Returns for 1 month follow up  Got some better but flared again , seen by PCP rx  augmentin and codeine cough syrup-for cough w green mucus last week--feels like she had since improved --spiriva inhaler started by Dr. Sherene Sires  d/t insurance would not cover tudorza. Has not started spiriva yet.  Feeling better has few days of augmentin left.  No fever or edema  Still DOE and cough on/off .  rec Finish Augmentin as directed.  Continue on Spiriva and Symbicort > insurance required change  spiriva to atrovent  04/23/2012 f/u ov/Fritzie Prioleau  Still smoking 2-3 cigs per day on symbicort/atrovent but tudorza has been approved and thinks it worked better.  Doe x one flight rec Please see patient  coordinator before you leave today  to schedule GI eval for bad heartburn Restart [place] one twice a day The key is to stop smoking completely before smoking completely stops you!  GERD diet  See calendar for specific medication instructions and bring it back for each and every office visit d  Late add: needs alpha one AT  [facility] next ov   03/16/2013 f/u ov/Tiari Andringa stopped all meds March 2014 then downhill since then Chief Complaint  Patient presents with  . Follow-up    Pt states having increased SOB for the past 3 months.  She states has SOB with or without exertion. She has prod cough for the past 2 months-prod with green to yellow sputum.   still smoking but not daily. On acei with sensation of choking/throat irritation /sever cough not much better on aug/pred rx    No obvious daytime variabilty or assoc chronic cough or cp or chest tightness, subjective wheeze overt sinus or hb symptoms. No unusual exp hx or h/o childhood pna/ asthma or knowledge of premature birth.       Sleeping ok without nocturnal  or early am exacerbation  of respiratory  c/o's or need for noct saba. Also denies any obvious fluctuation of symptoms with weather or environmental changes or other aggravating or alleviating factors except as outlined above   Current Medications, Allergies, Complete Past Medical History, Past Surgical History, Family History, and Social History were reviewed in [employer] record.  ROS  The following are not active complaints unless bolded sore throat, dysphagia, dental problems, itching, sneezing,  nasal congestion or excess/ purulent secretions, ear ache,   fever, chills, sweats, unintended wt loss, pleuritic or exertional cp, hemoptysis,  orthopnea pnd or leg swelling, presyncope, palpitations, heartburn, abdominal pain, anorexia, nausea, vomiting, diarrhea  or change in bowel or urinary habits, change in stools or urine, dysuria,hematuria,  rash, arthralgias,  visual complaints, headache, numbness weakness or ataxia or problems with walking or coordination,  change in mood/affect or memory.                          Objective:   Physical Exam   08/23/2011  Wt 145 >   149 01/24/2012 >  144 03/11/2012 > 04/23/2012  142 > 03/16/2013  130   HEENT mild turbinate edema.  Oropharynx clear     NECK:  Supple w/ fair ROM; no JVD; normal carotid impulses w/o bruits; no thyromegaly or nodules palpated; no lymphadenopathy.  RESP  slt distant BS  w/o, wheezes/ rales/ or rhonchi.no accessory muscle use, no dullness to percussion  CARD:  RRR, no m/r/g  , no peripheral edema,  pulses intact, no cyanosis or clubbing.  GI:   Soft & nt; nml bowel sounds; no organomegaly or masses detected.  Musco: Warm bil, no deformities or joint swelling noted.   Neuro: alert, no focal deficits noted.    Skin: Warm, no lesions or rashes   CT 08/19/11  No acute process     Assessment & Plan:

## 2013-03-16 NOTE — Patient Instructions (Addendum)
Stop lisinopril Start avapro 150 mg daily Restart Plan A = automatic = symbicort 160 Take 2 puffs first thing in am and then another 2 puffs about 12 hours later and tudorza one twice daily for now but work on [employer] B = Backup = albuterol inhaler up to every 4 hours if needed   Plan C = Nebulizer albtuerol only after try B first and not helping, but also ok to use up to every 4 hours if needed   dexilant 60 mg    Take 30-60 min before first meal of the day and Pepcid 20 mg one bedtime until return to office - this is the best way to tell whether stomach acid is contributing to your problem.    Take mucinex dm up to 1200 mg  every 12 hours and supplement if needed with  tramadol 50 mg up to 2 every 4 hours to suppress the urge to cough. Swallowing water or using ice chips/non mint and menthol containing candies (such as lifesavers or sugarless jolly ranchers) are also effective.  You should rest your voice and avoid activities that you know make you cough.  Once you have eliminated the cough for 3 straight days try reducing the tramadol first,  then the mucinex dm  See Tammy NP w/in 2 weeks with all your medications, even over the counter meds, separated in two separate bags, the ones you take no matter what vs the ones you stop once you feel better and take only as needed when you feel you need them.   Tammy  will generate for you a new user friendly medication calendar that will put [place] all on the same page re: your medication use.     Without this process, it simply isn't possible to assure that we are providing  your outpatient care  with  the attention to detail we feel you deserve.   If we cannot assure that you're getting that kind of care,  then we cannot manage your problem effectively from this clinic.  Once you have seen Tammy and we are sure that we're all on the same page with your medication use she will arrange follow up with me.

## 2013-03-16 NOTE — Telephone Encounter (Signed)
Rx has been sent to pharmacy. ATC to make her aware of this medication change and that medication has been sent to pharm NO answer [facility]

## 2013-03-17 NOTE — Telephone Encounter (Signed)
Called, spoke with pt - Informed her avapro was changed to diovan d/t insurance.   Advised [facility]. Pt aware and is to monitor bp with new  BP medication.  She is to call if she has problems with BP or any problems with new med.  She verbalized understanding and voiced no further questions or concerns at this time.

## 2013-03-18 DIAGNOSIS — I10 Essential (primary) hypertension: Secondary | ICD-10-CM | POA: Insufficient documentation

## 2013-03-18 NOTE — Assessment & Plan Note (Signed)
-   PFT's 11/29/2011 FEV1  1.08 (46%) ratio 43 and 15 % better p B2   - HFA 75% 11/29/2011 > added Tudorza>changed to spiriva /insurance > changed back to tudorza 04/23/2012    - HFA 90% 04/23/2012   DDX of  difficult airways managment all start with A and  include Adherence, Ace Inhibitors, Acid Reflux, Active Sinus Disease, Alpha 1 Antitripsin deficiency, Anxiety masquerading as Airways dz,  ABPA,  allergy(esp in young), Aspiration (esp in elderly), Adverse effects of DPI,  Active smokers, plus two Bs  = Bronchiectasis and Beta blocker use..and one C= CHF  Adherence is always the initial "prime suspect" and is a multilayered concern that requires a "trust but verify" approach in every patient - starting with knowing how to use medications, especially inhalers, correctly, keeping up with refills and understanding the fundamental difference between maintenance and prns vs those medications only taken for a very short course and then stopped and not refilled. Not clear at all she's taking her meds correctly. Symbicort Plan A > E reviewed (See instructions for specific recommendations which were reviewed directly with the patient who was given a copy with highlighter outlining the key components. )  ? acei effect > try off   ? Acid reflux > diet and rx reviewed  Active smoking > discussed sep  ?  Alpha One > check next ov

## 2013-03-18 NOTE — Assessment & Plan Note (Signed)
ACE inhibitors are problematic in  pts with airway complaints because  even experienced pulmonologists can't always distinguish ace effects from copd/asthma.  By themselves they don't actually cause a problem, much like oxygen can't by itself start a fire, but they certainly serve as a powerful catalyst or enhancer for any "fire"  or inflammatory process in the upper airway, be it caused by an ET  tube or more commonly reflux (especially in the obese or pts with known GERD or who are on biphoshonates).    In the era of ARB near equivalency until we have a better handle on the reversibility of the airway problem, it just makes sense to avoid ACEI  entirely in the short run and then decide later, having established a level of airway control using a reasonable limited regimen, whether to add back ace but even then being very careful to observe the pt for worsening airway control and number of meds used/ needed to control symptoms.   Try diovan 160 mg daily     Each maintenance medication was reviewed in detail including most importantly the difference between maintenance and as needed and under what circumstances the prns are to be used.  Please see instructions for details which were reviewed in writing and the patient given a copy.

## 2013-03-18 NOTE — Assessment & Plan Note (Signed)

## 2013-03-20 ENCOUNTER — Telehealth: Payer: Self-pay | Admitting: Internal Medicine

## 2013-03-20 MED ORDER — NYSTATIN 100000 UNIT/ML MT SUSP: 400000.0000 [IU] | Freq: Three times a day (TID) | OROMUCOSAL | Status: DC

## 2013-03-20 MED ORDER — ALBUTEROL SULFATE (2.5 MG/3ML) 0.083% IN NEBU: 2.5000 mg | INHALATION_SOLUTION | RESPIRATORY_TRACT | Status: DC | PRN

## 2013-03-20 MED ORDER — CLOTRIMAZOLE 10 MG MT LOZG: 10.0000 mg | LOZENGE | Freq: Every day | OROMUCOSAL | Status: DC

## 2013-03-20 NOTE — Telephone Encounter (Signed)
Spoke with the pt and notified of recs per MW  She verbalized understanding  Rx was sent to pharm  

## 2013-03-20 NOTE — Telephone Encounter (Signed)
I spoke with pt and she stated she has thrush. She c/o white patches in her mouth, her throat and mouth is red and painful. She stated she rinses after she uses her inhalers. She is requesting to have something called in. Please advise TP thanks  Allergies  Allergen Reactions  . Cymbalta [Duloxetine Hcl] Nausea And Vomiting  . Pregabalin Swelling

## 2013-03-20 NOTE — Telephone Encounter (Signed)
Rx was sent for nystatin per TP

## 2013-03-20 NOTE — Telephone Encounter (Signed)
mycelex troches #35 1 troche five times daily x 7 days  Brush/rinse and gargle after inhaler use.  Please contact office for sooner follow up if symptoms do not improve or worsen or seek emergency care

## 2013-03-30 ENCOUNTER — Encounter: Payer: Medicaid Other | Admitting: Adult Health

## 2013-04-07 ENCOUNTER — Encounter: Payer: Self-pay | Admitting: Adult Health

## 2013-04-07 ENCOUNTER — Other Ambulatory Visit: Payer: Medicaid Other

## 2013-04-07 ENCOUNTER — Ambulatory Visit (INDEPENDENT_AMBULATORY_CARE_PROVIDER_SITE_OTHER): Payer: Medicaid Other | Admitting: Adult Health

## 2013-04-07 ENCOUNTER — Ambulatory Visit (INDEPENDENT_AMBULATORY_CARE_PROVIDER_SITE_OTHER)
Admission: RE | Admit: 2013-04-07 | Discharge: 2013-04-07 | Disposition: A | Discharge status: Home / Self Care | Payer: Medicaid Other | Source: Ambulatory Visit | Attending: Adult Health | Admitting: Adult Health

## 2013-04-07 VITALS — BP 126/74 | HR 86 | Temp 99.3°F | Ht 58.5 in | Wt 131.4 lb

## 2013-04-07 DIAGNOSIS — J449 Chronic obstructive pulmonary disease, unspecified: Secondary | ICD-10-CM

## 2013-04-07 MED ORDER — PREDNISONE 10 MG PO TABS: ORAL_TABLET | ORAL | Status: DC

## 2013-04-07 MED ORDER — CEFDINIR 300 MG PO CAPS: 300.0000 mg | ORAL_CAPSULE | Freq: Two times a day (BID) | ORAL | Status: DC

## 2013-04-07 NOTE — Addendum Note (Signed)
Addended by: Boone Master E on: 04/07/2013 03:57 PM   Modules accepted: Orders

## 2013-04-07 NOTE — Patient Instructions (Addendum)
Omnicef 300mg  Twice daily  For 7 days  Prednisone taper over next week.  Must quit smoking .  I will call with chest xray  And lab results.  Brush /rinse and gargle after use  May use Armour/hammer baking soda/peroxide toothpaste  Mucinex DM Twice daily  As needed  Cough/congestion  Follow med calendar closely and bring to each visit  follow up Dr. Sherene Sires  In 2 months  and As needed

## 2013-04-07 NOTE — Assessment & Plan Note (Signed)
Recurrent exacerbation in pt actively smoking  Check alpha 1  Check cxr today  Patient's medications were reviewed today and patient education was given. Computerized medication calendar was adjusted/completed   Plan  Omnicef 300mg  Twice daily  For 7 days  Prednisone taper over next week.  Must quit smoking .  I will call with chest xray  And lab results.  Brush /rinse and gargle after use  May use Armour/hammer baking soda/peroxide toothpaste  Mucinex DM Twice daily  As needed  Cough/congestion  Follow med calendar closely and bring to each visit  follow up Dr. Sherene Sires  In 2 months  and As needed

## 2013-04-07 NOTE — Addendum Note (Signed)
Addended by: Orma Flaming D on: 04/07/2013 03:29 PM   Modules accepted: Orders

## 2013-04-07 NOTE — Progress Notes (Signed)
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 May have helped some but did not help breathing or coughing severe to point of going to ER 2/9 no better on prednisone on this course, maybe better with ventolin last used 3 hours prior to OV . rec cycylical cough regimen Augmentin x 10 days If short of breath only use perforomist every 12 hours per neb   09/07/2011 f/u ov/Wert still smoking  Cough and sob Maybe better while on performist and worse when stopped it and no longer coughing up green stuff, only took pred a few days p ov then stopped.   rec Stop smoking completely - it's the most important aspect of your care! Mucinex dm 2 every every hours Symbicort 160 Take 2 puffs first thing in am and then another 2 puffs about 12 hours later.  - ok to use Prednisone 10 mg take  4 each am x 2 days,   2 each am x 2 days,  1 each am x2days and stop if not improving on symbicort Work on inhaler technique:   GERD diet Please schedule a follow up office visit in 2 weeks, sooner if needed with all medications including over the counters in hand.   10/12/2011 f/u ov/Wert cc Patient states cough is better since last visit. Patient also c/o chest tightness. Also states has cut back on smoking to about 3 a day.  No excess sputum or daytime need for saba, no limiting sob rec No change in your medications Completely stop smoking before  smoking completely stops you  Please schedule a follow up office visit in 6 weeks, call sooner if needed with pft's on return   11/29/2011 f/u ov/Wert  Still smoking cc much worse sob since April 20th severe cough >  Green mucus > amoxillicin and prednisone and cough syrup > no improvement at all.  AM of ov woke up at nl hour on day of ov and held all meds until ov for pft's. Does feel some better with albuterol hfa, no longer has neb albuterol.  No overt sinus or hb symptoms >>pred taper    01/24/2012 Follow up and med review  patient returns for a 6 week  followup for COPD She is brought all her medications and her review We reviewed all her medications and organized them into a medication calendar with patient education She has ran out [place] the inhaler Feels that she continues to have shortness of breath with activity  no increased cough or wheezing rec Restart Tudorza 1 puff Twice daily   Brush /rinse and gargle after use  May use Armour/hammer baking soda/peroxide toothpaste  Mycelex troches five times daily for 1 week for thrush Augmentin 875mg  Twice daily  For 7 days  Mucinex DM Twice daily  As needed  Cough/congestion  Follow med calendar closely  and bring to each visi   03/11/2012 Follow up COPD  Returns for 1 month follow up  Got some better but flared again , seen by PCP rx  augmentin and codeine cough syrup-for cough w green mucus last week--feels like she had since improved --spiriva inhaler started by Dr. Sherene Sires  d/t insurance would not cover tudorza. Has not started spiriva yet.  Feeling better has few days of augmentin left.  No fever or edema  Still DOE and cough on/off .  rec Finish Augmentin as directed.  Continue on Spiriva and Symbicort > insurance required change  spiriva to atrovent  04/23/2012 f/u ov/Wert  Still smoking 2-3 cigs per day on symbicort/atrovent but tudorza has been approved and thinks it worked better.  Doe x one flight rec Please see patient  coordinator before you leave today  to schedule GI eval for bad heartburn Restart [employer] one twice a day The key is to stop smoking completely before smoking completely stops you!  GERD diet  See calendar for specific medication instructions and bring it back for each and every office visit d  Late add: needs alpha one AT  Genotype next ov   03/16/2013 f/u ov/Wert stopped all meds March 2014 then downhill since then Chief Complaint  Patient presents with  . Follow-up    Pt states having increased SOB for the past 3 months.  She states has SOB with or without exertion. She has prod cough for the past 2 months-prod with green to yellow sputum.   still smoking but not daily. On acei with sensation of choking/throat irritation /sever cough not much better on aug/pred rx  >d/c ace , PPI /pepcid   04/07/2013 Follow up and med review  She is brought all her medications and her review We reviewed all her medications and organized them into a medication calendar with patient education It appears she is taking meds correctly.   Complains of  prod cough with thick green/yellow mucus, increased SOB, wheezing, chest tightness, hoarseness, some low grade temp x15days Went to ENT [place] screening last week for hoarseness was  Informed throat was ok. But to come back for more detailed exam.  No hemoptysis, chest pain, orthopnea, edema.  Has lost 10 lbs over last year.  Last ov ACE was stopped , has not seen any change in hoarseness or cough.  Still smoking , cessation discussed.      Current Medications, Allergies, Complete Past Medical History, Past Surgical History, Family History, and Social History were reviewed in [employer] record.  ROS  The following are not active complaints unless bolded sore throat, dysphagia, dental problems, itching, s ear ache,   fever, chills, sweats, unintended wt loss, pleuritic or exertional cp, hemoptysis,  orthopnea pnd or leg swelling,  presyncope, palpitations, heartburn, abdominal pain, anorexia, nausea, vomiting, diarrhea  or change in bowel or urinary habits, change in stools or urine, dysuria,hematuria,  rash, arthralgias, visual complaints, headache, numbness weakness or ataxia or problems with walking or coordination,  change in mood/affect or memory.                          Objective:   Physical Exam   08/23/2011  Wt 145 >   149 01/24/2012 >  144 03/11/2012 > 04/23/2012  142 > 03/16/2013  130 >04/07/2013   HEENT mild turbinate edema.  Oropharynx clear     NECK:  Supple w/ fair ROM; no JVD; normal  carotid impulses w/o bruits; no thyromegaly or nodules palpated; no lymphadenopathy.  RESP  slt distant BS  w/o, wheezes/ rales/ or rhonchi.no accessory muscle use, no dullness to percussion  CARD:  RRR, no m/r/g  , no peripheral edema, pulses intact, no cyanosis or clubbing.  GI:   Soft & nt; nml bowel sounds; no organomegaly or masses detected.  Musco: Warm bil, no deformities or joint swelling noted.   Neuro: alert, no focal deficits noted.    Skin: Warm, no lesions or rashes   CT 08/19/11  No acute process     Assessment & Plan:

## 2013-04-08 NOTE — Progress Notes (Signed)
Quick Note:  Advised pt of lab results per TP. Pt verbalized understanding and has no questions or concerns at this time Will call if not feeling better within next 3 days ______

## 2013-04-09 NOTE — Addendum Note (Signed)
Addended by: Boone Master E on: 04/09/2013 04:18 PM   Modules accepted: Orders

## 2013-04-11 LAB — ALPHA-1 ANTITRYPSIN PHENOTYPE: A-1 Antitrypsin: 175 mg/dL (ref 83–199)

## 2013-04-13 ENCOUNTER — Encounter: Payer: Self-pay | Admitting: Adult Health

## 2013-04-14 ENCOUNTER — Telehealth: Payer: Self-pay | Admitting: *Deleted

## 2013-04-14 MED ORDER — CEFDINIR 300 MG PO CAPS: 300.0000 mg | ORAL_CAPSULE | Freq: Two times a day (BID) | ORAL | Status: DC

## 2013-04-14 NOTE — Telephone Encounter (Signed)
I called pt and gave lab results. She stated she is better than when she saw TP. She is still coughing up green-yellow phlem, SOB still the same, hoarseness. Denies any hoarseness and no trouble swallowing. She has 1 day left of her ABX but thinks she needs this extended. Please advise TP thanks  Allergies  Allergen Reactions  . Cymbalta [Duloxetine Hcl] Nausea And Vomiting  . Pregabalin Swelling

## 2013-04-14 NOTE — Telephone Encounter (Signed)
rx sent and pt is aware. Deondrea Aguado, CMA  

## 2013-04-14 NOTE — Telephone Encounter (Signed)
Extend omnicef for 3 additional days  omnicef 300mg  #6 1 po Twice daily   No refills  Please contact office for sooner follow up if symptoms do not improve or worsen or seek emergency care  follow up as planned and As needed

## 2013-04-17 ENCOUNTER — Telehealth: Payer: Self-pay | Admitting: Internal Medicine

## 2013-04-17 MED ORDER — BUDESONIDE-FORMOTEROL FUMARATE 160-4.5 MCG/ACT IN AERO: 2.0000 | INHALATION_SPRAY | Freq: Two times a day (BID) | RESPIRATORY_TRACT | Status: DC

## 2013-04-17 MED ORDER — ACLIDINIUM BROMIDE 400 MCG/ACT IN AEPB: 1.0000 | INHALATION_SPRAY | Freq: Two times a day (BID) | RESPIRATORY_TRACT | Status: DC

## 2013-04-17 NOTE — Telephone Encounter (Signed)
So call in HCTZ 25mg  daily As needed  Edema #30 .  follow up with PCP for B/P  Please contact office for sooner follow up if symptoms do not improve or worsen or seek emergency care

## 2013-04-17 NOTE — Telephone Encounter (Signed)
Spoke with pt  Advised rxs were sent to pharm   She is c/o swelling in her hands and feet for the past 5-6 night She states that her feet feel tight and uncomfortable  She called her pharmacist and was advised that it could be the diovan, since it does not have a dieretic in it, and the lisinopril that she had been on had HCTZ  Looks like we were not aware of this since looking at med hx it only states lisinopril 20 mg   TP, please advise, thanks!

## 2013-04-17 NOTE — Telephone Encounter (Signed)
Pt says she can be reached at [phone].[name]

## 2013-04-17 NOTE — Telephone Encounter (Signed)
Yes- this is what the pt said when I spoke with her this am

## 2013-04-17 NOTE — Telephone Encounter (Signed)
So was she on HCTZ before with the Lisinopril .?????

## 2013-04-17 NOTE — Telephone Encounter (Signed)
LMTCB for the pt 

## 2013-04-20 MED ORDER — HYDROCHLOROTHIAZIDE 25 MG PO TABS: 25.0000 mg | ORAL_TABLET | Freq: Every day | ORAL | Status: DC | PRN

## 2013-04-20 NOTE — Telephone Encounter (Signed)
LMTCB

## 2013-04-20 NOTE — Telephone Encounter (Signed)
I spoke with pt. She is aware of recs. I have sent in RX for HCTZ. Nothing further needed

## 2013-04-21 ENCOUNTER — Ambulatory Visit: Payer: Medicaid Other | Admitting: Internal Medicine

## 2013-04-23 ENCOUNTER — Telehealth: Payer: Self-pay | Admitting: Internal Medicine

## 2013-04-23 NOTE — Telephone Encounter (Signed)
Pt saw MW on 03-16-13 and states she is still having increased SOB and productive cough with yellow phlegm. Offered an appt today but pt states she does not have transportation today but can come in the AM so appt set or 9:15 tomorrow. Carron Curie, CMA

## 2013-04-24 ENCOUNTER — Ambulatory Visit (INDEPENDENT_AMBULATORY_CARE_PROVIDER_SITE_OTHER): Payer: Medicaid Other | Admitting: Internal Medicine

## 2013-04-24 ENCOUNTER — Encounter: Payer: Self-pay | Admitting: Internal Medicine

## 2013-04-24 VITALS — BP 110/70 | HR 100 | Temp 98.1°F | Ht 58.5 in | Wt 132.0 lb

## 2013-04-24 DIAGNOSIS — J449 Chronic obstructive pulmonary disease, unspecified: Secondary | ICD-10-CM

## 2013-04-24 DIAGNOSIS — F17200 Nicotine dependence, unspecified, uncomplicated: Secondary | ICD-10-CM

## 2013-04-24 MED ORDER — ACAPELLA MISC: Status: DC

## 2013-04-24 MED ORDER — AMOXICILLIN-POT CLAVULANATE 875-125 MG PO TABS: 1.0000 | ORAL_TABLET | Freq: Two times a day (BID) | ORAL | Status: DC

## 2013-04-24 MED ORDER — PREDNISONE (PAK) 10 MG PO TABS: ORAL_TABLET | ORAL | Status: DC

## 2013-04-24 NOTE — Progress Notes (Signed)
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 May have helped some but did not help breathing or coughing severe to point of going to ER 2/9 no better on prednisone on this course, maybe better with ventolin last used 3 hours prior to OV . rec cycylical cough regimen Augmentin x 10 days If short of breath only use perforomist every 12 hours per neb   09/07/2011 f/u ov/Eamon Tantillo still smoking  Cough and sob Maybe better while on performist and worse when stopped it and no longer coughing up green stuff, only took pred a few days p ov then stopped.   rec Stop smoking completely - it's the most important aspect of your care! Mucinex dm 2 every every hours Symbicort 160 Take 2 puffs first thing in am and then another 2 puffs about 12 hours later.  - ok to use Prednisone 10 mg take  4 each am x 2 days,   2 each am x 2 days,  1 each am x2days and stop if not improving on symbicort Work on inhaler technique:   GERD diet Please schedule a follow up office visit in 2 weeks, sooner if needed with all medications including over the counters in hand.   10/12/2011 f/u ov/Mohammad Granade cc Patient states cough is better since last visit. Patient also c/o chest tightness. Also states has cut back on smoking to about 3 a day.  No excess sputum or daytime need for saba, no limiting sob rec No change in your medications Completely stop smoking before  smoking completely stops you  Please schedule a follow up office visit in 6 weeks, call sooner if needed with pft's on return   11/29/2011 f/u ov/Travanti Mcmanus  Still smoking cc much worse sob since April 20th severe cough >  Green mucus > amoxillicin and prednisone and cough syrup > no improvement at all.  AM of ov woke up at nl hour on day of ov and held all meds until ov for pft's. Does feel some better with albuterol hfa, no longer has neb albuterol.  No overt sinus or hb symptoms >>pred taper    01/24/2012 Follow up and med review  patient returns for a 6 week  followup for COPD She is brought all her medications and her review We reviewed all her medications and organized them into a medication calendar with patient education She has ran out [place] the inhaler Feels that she continues to have shortness of breath with activity  no increased cough or wheezing rec Restart Tudorza 1 puff Twice daily   Brush /rinse and gargle after use  May use Armour/hammer baking soda/peroxide toothpaste  Mycelex troches five times daily for 1 week for thrush Augmentin 875mg  Twice daily  For 7 days  Mucinex DM Twice daily  As needed  Cough/congestion  Follow med calendar closely  and bring to each visi   03/11/2012 Follow up COPD  Returns for 1 month follow up  Got some better but flared again , seen by PCP rx  augmentin and codeine cough syrup-for cough w green mucus last week--feels like she had since improved --spiriva inhaler started by Dr. Sherene Sires  d/t insurance would not cover tudorza. Has not started spiriva yet.  Feeling better has few days of augmentin left.  No fever or edema  Still DOE and cough on/off .  rec Finish Augmentin as directed.  Continue on Spiriva and Symbicort > insurance required change  spiriva to atrovent  04/23/2012 f/u ov/Lela Gell  Still smoking 2-3 cigs per day on symbicort/atrovent but tudorza has been approved and thinks it worked better.  Doe x one flight rec Please see patient  coordinator before you leave today  to schedule GI eval for bad heartburn Restart [employer] one twice a day The key is to stop smoking completely before smoking completely stops you!  GERD diet  See calendar for specific medication instructions and bring it back for each and every office visit d  Late add: needs alpha one AT  Genotype next ov   03/16/2013 f/u ov/Kathaleya Mcduffee stopped all meds March 2014 then downhill since then Chief Complaint  Patient presents with  . Follow-up    Pt states having increased SOB for the past 3 months.  She states has SOB with or without exertion. She has prod cough for the past 2 months-prod with green to yellow sputum.   still smoking but not daily. On acei with sensation of choking/throat irritation /sever cough not much better on aug/pred rx  >d/c ace , PPI /pepcid   04/07/2013 Follow up and med review  She is brought all her medications and her review We reviewed all her medications and organized them into a medication calendar with patient education It appears she is taking meds correctly.   Complains of  prod cough with thick green/yellow mucus, increased SOB, wheezing, chest tightness, hoarseness, some low grade temp x15 days Went to ENT [place] screening last week for hoarseness was  Informed throat was ok. But to come back for more detailed exam.  No hemoptysis, chest pain, orthopnea, edema.  Has lost 10 lbs over last year.  Last ov ACE was stopped , has not seen any change in hoarseness or cough.  Still smoking , cessation discussed.  rec Omnicef 300mg  Twice daily  For 7 days  Prednisone taper over next week.  Must quit smoking .  I will call with chest xray  And lab results.  Brush /rinse and gargle after use  May use Armour/hammer baking soda/peroxide toothpaste  Mucinex DM Twice daily  As needed  Cough/congestion  Follow med calendar closely and bring to each visit    04/24/2013 f/u ov/Tad Fancher did not bring med cal re: aecopd breathing  Bad since  2010    Chief Complaint  Patient presents with  . COPD    Breathing is getting worse. Saw TP on 03/16/13. Reports SOB, coughing with production of yellow mucus. Finished abx and pred taper with no relief.    Not even better with saba   No obvious day to day or daytime variabilty or  cp or chest tightness, subjective wheeze overt sinus or hb symptoms. No unusual exp hx or h/o childhood pna/ asthma or knowledge of premature birth.   . Also denies any obvious fluctuation of symptoms with weather or environmental changes or other aggravating or  alleviating factors except as outlined above   Current Medications, Allergies, Complete Past Medical History, Past Surgical History, Family History, and Social History were reviewed in [employer] record.  ROS  The following are not active complaints unless bolded sore throat, dysphagia, dental problems, itching, sneezing,  nasal congestion or excess/ purulent secretions, ear ache,   fever, chills, sweats, unintended wt loss, pleuritic or exertional cp, hemoptysis,  orthopnea pnd or leg swelling, presyncope, palpitations, heartburn, abdominal pain, anorexia, nausea, vomiting, diarrhea  or change in bowel or urinary habits, change in stools or urine, dysuria,hematuria,  rash, arthralgias, visual complaints, headache, numbness weakness or ataxia or problems with walking or coordination,  change in mood/affect or memory.                           Objective:   Physical Exam   08/23/2011  Wt 145 >   149 01/24/2012 >  144 03/11/2012 > 04/23/2012  142 > 03/16/2013  130 >04/07/2013 > 132 04/24/13   amb anxious wf nad with congested sounding upper airway coughing.   HEENT mild turbinate edema.  Oropharynx clear     NECK:  Supple w/ fair ROM; no JVD; normal carotid impulses w/o bruits; no thyromegaly or nodules palpated; no lymphadenopathy.  RESP  slt distant BS  w/o, wheezes/ rales/ or rhonchi.no accessory muscle use, no dullness to  percussion  CARD:  RRR, no m/r/g  , no peripheral edema, pulses intact, no cyanosis or clubbing.  GI:   Soft & nt; nml bowel sounds; no organomegaly or masses detected.  Musco: Warm bil, no deformities or joint swelling noted.   Neuro: alert, no focal deficits noted.    Skin: Warm, no lesions or rashes   04/08/13  Lung hyperexpansion without acute cardiopulmonary disease   Assessment & Plan:

## 2013-04-24 NOTE — Patient Instructions (Addendum)
Prednisone 10 mg take  4 each am x 2 days,   2 each am x 2 days,  1 each am x 2 days and stop augmentin 875 twice daily x 10 days As per calendar:  For cough mucinex dm 2 every 12 hours and add flutter valve as much as you can to help cough mucus up effectively   Please schedule a follow up office visit in 2  weeks, sooner if needed with all meds in separate bags and   the calendar

## 2013-04-26 NOTE — Assessment & Plan Note (Signed)
-   PFT's 11/29/2011 FEV1  1.08 (46%) ratio 43 and 15 % better p B2   - HFA 75% 11/29/2011 > added Tudorza>changed to spiriva /insurance > changed back to tudorza 04/23/2012    - HFA 75% 04/24/2013  -med calendar 04/07/2013  -alpha 1 04/07/2013 >175 MM   Symptoms are markedly disproportionate to objective findings and not clear this is all a  lung problem but pt does appear to have difficult airway management issues. DDX of  difficult airways managment all start with A and  include Adherence, Ace Inhibitors, Acid Reflux, Active Sinus Disease, Alpha 1 Antitripsin deficiency, Anxiety masquerading as Airways dz,  ABPA,  allergy(esp in young), Aspiration (esp in elderly), Adverse effects of DPI,  Active smokers, plus two Bs  = Bronchiectasis and Beta blocker use..and one C= CHF   Adherence is always the initial "prime suspect" and is a multilayered concern that requires a "trust but verify" approach in every patient - starting with knowing how to use medications, especially inhalers, correctly, keeping up with refills and understanding the fundamental difference between maintenance and prns vs those medications only taken for a very short course and then stopped and not refilled. The proper method of use, as well as anticipated side effects, of a metered-dose inhaler are discussed and demonstrated to the patient. Improved effectiveness after extensive coaching during this visit to a level of approximately  75%   ? Active / acute sinus dz > augmentin x 10 days then sinus ct if not better  Active smoking > discussed separately  Anxiety> dx of exclusion     Each maintenance medication was reviewed in detail including most importantly the difference between maintenance and as needed and under what circumstances the prns are to be used. This was done in the context of a medication calendar review which provided the patient with a user-friendly unambiguous mechanism for medication administration and  reconciliation and provides an action plan for all active problems. It is critical that this be shown to every doctor  for modification during the office visit if necessary so the patient can use it as a working document.

## 2013-04-26 NOTE — Assessment & Plan Note (Signed)

## 2013-04-30 ENCOUNTER — Telehealth: Payer: Self-pay | Admitting: Internal Medicine

## 2013-04-30 NOTE — Telephone Encounter (Signed)
Received PA for [employer] and was able to get med approved x 1 yr Approval number [id number] Fax was sent to [facility] letting them know Pt aware

## 2013-05-08 ENCOUNTER — Ambulatory Visit (INDEPENDENT_AMBULATORY_CARE_PROVIDER_SITE_OTHER): Payer: Medicaid Other | Admitting: Internal Medicine

## 2013-05-08 ENCOUNTER — Encounter: Payer: Self-pay | Admitting: Internal Medicine

## 2013-05-08 VITALS — BP 108/70 | HR 89 | Temp 98.1°F | Ht 58.5 in | Wt 134.0 lb

## 2013-05-08 DIAGNOSIS — Z23 Encounter for immunization: Secondary | ICD-10-CM

## 2013-05-08 DIAGNOSIS — J449 Chronic obstructive pulmonary disease, unspecified: Secondary | ICD-10-CM

## 2013-05-08 DIAGNOSIS — F17200 Nicotine dependence, unspecified, uncomplicated: Secondary | ICD-10-CM

## 2013-05-08 MED ORDER — PREDNISONE (PAK) 10 MG PO TABS: ORAL_TABLET | ORAL | Status: DC

## 2013-05-08 NOTE — Patient Instructions (Addendum)
Prednisone 10 mg take  4 each am x 2 days,   2 each am x 2 days,  1 each am x 2 days and stop   See calendar for specific medication instructions and bring it back for each and every office visit for every healthcare provider you see.  Without it,  you may not receive the best quality medical care that we feel you deserve.  You will note that the calendar groups together  your maintenance  medications that are timed at particular times of the day.  Think of this as your checklist for what your doctor has instructed you to do until your next evaluation to see what benefit  there is  to staying on a consistent group of medications intended to keep you well.  The other group at the bottom is entirely up to you to use as you see fit  for specific symptoms that may arise between visits that require you to treat them on an as needed basis.  Think of this as your action plan or "what if" list.   Separating the top medications from the bottom group is fundamental to providing you adequate care going forward.    Please schedule a follow up office visit in 4 weeks, sooner if needed to see Tammy NP with all meds and your med calendar in hand

## 2013-05-08 NOTE — Progress Notes (Signed)
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 May have helped some but did not help breathing or coughing severe to point of going to ER 2/9 no better on prednisone on this course, maybe better with ventolin last used 3 hours prior to OV . rec cycylical cough regimen Augmentin x 10 days If short of breath only use perforomist every 12 hours per neb   09/07/2011 f/u ov/[name] still smoking  Cough and sob Maybe better while on performist and worse when stopped it and no longer coughing up green stuff, only took pred a few days p ov then stopped.   rec Stop smoking completely - it's the most important aspect of your care! Mucinex dm 2 every every hours Symbicort 160 Take 2 puffs first thing in am and then another 2 puffs about 12 hours later.  - ok to use Prednisone 10 mg take  4 each am x 2 days,   2 each am x 2 days,  1 each am x2days and stop if not improving on symbicort Work on inhaler technique:   GERD diet Please schedule a follow up office visit in 2 weeks, sooner if needed with all medications including over the counters in hand.   10/12/2011 f/u ov/[name] cc Patient states cough is better since last visit. Patient also c/o chest tightness. Also states has cut back on smoking to about 3 a day.  No excess sputum or daytime need for saba, no limiting sob rec No change in your medications Completely stop smoking before  smoking completely stops you  Please schedule a follow up office visit in 6 weeks, call sooner if needed with pft's on return   11/29/2011 f/u ov/[name]  Still smoking cc much worse sob since April 20th severe cough >  Green mucus > amoxillicin and prednisone and cough syrup > no improvement at all.  AM of ov woke up at nl hour on day of ov and held all meds until ov for pft's. Does feel some better with albuterol hfa, no longer has neb albuterol.  No overt sinus or hb symptoms >>pred taper    01/24/2012 Follow up and med review  patient returns for a 6 week  followup for COPD She is brought all her medications and her review We reviewed all her medications and organized them into a medication calendar with patient education She has ran out [place] the inhaler Feels that she continues to have shortness of breath with activity  no increased cough or wheezing rec Restart Tudorza 1 puff Twice daily   Brush /rinse and gargle after use  May use Armour/hammer baking soda/peroxide toothpaste  Mycelex troches five times daily for 1 week for thrush Augmentin 875mg  Twice daily  For 7 days  Mucinex DM Twice daily  As needed  Cough/congestion  Follow med calendar closely  and bring to each visi   03/11/2012 Follow up COPD  Returns for 1 month follow up  Got some better but flared again , seen by PCP rx  augmentin and codeine cough syrup-for cough w green mucus last week--feels like she had since improved --spiriva inhaler started by Dr. Melvyn Novas  d/t insurance would not cover tudorza. Has not started spiriva yet.  Feeling better has few days of augmentin left.  No fever or edema  Still DOE and cough on/off .  rec Finish Augmentin as directed.  Continue on Spiriva and Symbicort > insurance required change  spiriva to atrovent  04/23/2012 f/u ov/[name]  Still smoking 2-3 cigs per day on symbicort/atrovent but tudorza has been approved and thinks it worked better.  Doe x one flight rec Please see patient  coordinator before you leave today  to schedule GI eval for bad heartburn Restart Caprice Renshaw one twice a day The key is to stop smoking completely before smoking completely stops you!  GERD diet  See calendar for specific medication instructions and bring it back for each and every office visit d  Late add: needs alpha one AT  Genotype next ov   03/16/2013 f/u ov/[name] stopped all meds March 2014 then downhill since then Chief Complaint  Patient presents with  . Follow-up    Pt states having increased SOB for the past 3 months.  She states has SOB with or without exertion. She has prod cough for the past 2 months-prod with green to yellow sputum.   still smoking but not daily. On acei with sensation of choking/throat irritation /sever cough not much better on aug/pred rx  >d/c ace , PPI /pepcid   04/07/2013 Follow up and med review  She is brought all her medications and her review We reviewed all her medications and organized them into a medication calendar with patient education It appears she is taking meds correctly.   Complains of  prod cough with thick green/yellow mucus, increased SOB, wheezing, chest tightness, hoarseness, some low grade temp x15 days Went to [facility] last week for hoarseness was  Informed throat was ok. But to come back for more detailed exam.  No hemoptysis, chest pain, orthopnea, edema.  Has lost 10 lbs over last year.  Last ov ACE was stopped , has not seen any change in hoarseness or cough.  Still smoking , cessation discussed.  rec Omnicef 300mg  Twice daily  For 7 days  Prednisone taper over next week.  Must quit smoking .  I will call with chest xray  And lab results.  Brush /rinse and gargle after use  May use Armour/hammer baking soda/peroxide toothpaste  Mucinex DM Twice daily  As needed  Cough/congestion  Follow med calendar closely and bring to each visit    04/24/2013 f/u ov/[name] did not bring med cal re: aecopd breathing  Bad since  2010    Chief Complaint  Patient presents with  . COPD    Breathing is getting worse. Saw TP on 03/16/13. Reports SOB, coughing with production of yellow mucus. Finished abx and pred taper with no relief.    Not even better with saba rec Prednisone 10 mg take  4 each am x 2 days,   2 each am x 2 days,  1 each am x 2 days and stop augmentin 875 twice daily x 10 days As per calendar:  For cough mucinex dm 2 every 12 hours and add flutter valve as much as you  can to help cough mucus up effectively     05/08/2013 f/u ov/[name] re: copd/ab still smoking, did not bring calendar but did bring most meds, not mucinex dm Chief Complaint  Patient presents with  . Follow-up    Pt states that her breathing had improved a great deal while on prednisone, and now is getting worse off of the medication. Her cough had also improved, but is now worse and is prod with minimal green sputum. She has been taking proair 2-3 times per day for the past wk.      No obvious day to day or daytime variabilty or  cp or chest tightness, subjective wheeze overt sinus or hb symptoms. No unusual exp hx or h/o childhood pna/ asthma or knowledge of premature birth.   Also denies any obvious fluctuation of symptoms with weather or environmental changes or other aggravating or alleviating factors except as outlined above   Current Medications, Allergies, Complete Past Medical History, Past Surgical History, Family History, and Social History were reviewed in [employer] record.  ROS  The following are not active complaints unless bolded sore throat, dysphagia, dental problems, itching, sneezing,  nasal congestion or excess/ purulent secretions, ear ache,   fever, chills, sweats, unintended wt loss, pleuritic or exertional cp, hemoptysis,  orthopnea pnd or leg swelling, presyncope, palpitations, heartburn, abdominal pain, anorexia, nausea, vomiting, diarrhea  or change in bowel or urinary habits, change in  stools or urine, dysuria,hematuria,  rash, arthralgias, visual complaints, headache, numbness weakness or ataxia or problems with walking or coordination,  change in mood/affect or memory.                           Objective:   Physical Exam   08/23/2011  Wt 145 >   149 01/24/2012 >  144 03/11/2012 > 04/23/2012  142 > 03/16/2013  130 >04/07/2013 > 132 04/24/13 > 134 05/08/2013   amb anxious wf nad with congested sounding upper airway coughing.   HEENT mild turbinate edema.  Oropharynx clear     NECK:  Supple w/ fair ROM; no JVD; normal carotid impulses w/o bruits; no thyromegaly or nodules palpated; no lymphadenopathy.  RESP  slt distant BS  w/o, wheezes/ rales/ or rhonchi.no accessory muscle use, no dullness to percussion  CARD:  RRR, no m/r/g  , no peripheral edema, pulses intact, no cyanosis or clubbing.  GI:   Soft & nt; nml bowel sounds; no organomegaly or masses detected.  Musco: Warm bil, no deformities or joint swelling noted.   Neuro: alert, no focal deficits noted.    Skin: Warm, no lesions or rashes   04/08/13  Lung hyperexpansion without acute cardiopulmonary disease   Assessment & Plan:

## 2013-05-09 NOTE — Assessment & Plan Note (Signed)
-   PFT's 11/29/2011 FEV1  1.08 (46%) ratio 43 and 15 % better p B2   - HFA 75% 11/29/2011 > added Tudorza>changed to spiriva /insurance > changed back to tudorza 04/23/2012    - HFA 75% 04/24/2013  -med calendar 04/07/2013  -alpha 1 04/07/2013 >175 MM    DDX of  difficult airways managment all start with A and  include Adherence, Ace Inhibitors, Acid Reflux, Active Sinus Disease, Alpha 1 Antitripsin deficiency, Anxiety masquerading as Airways dz,  ABPA,  allergy(esp in young), Aspiration (esp in elderly), Adverse effects of DPI,  Active smokers, plus two Bs  = Bronchiectasis and Beta blocker use..and one C= CHF  Adherence is always the initial "prime suspect" and is a multilayered concern that requires a "trust but verify" approach in every patient - starting with knowing how to use medications, especially inhalers, correctly, keeping up with refills and understanding the fundamental difference between maintenance and prns vs those medications only taken for a very short course and then stopped and not refilled. Still not getting the message to keep up with meds using med calendar, re -wrote one in entirety today  Active smoker > discussed separately

## 2013-05-09 NOTE — Assessment & Plan Note (Signed)

## 2013-06-03 ENCOUNTER — Encounter: Payer: Medicaid Other | Admitting: Adult Health

## 2013-06-05 ENCOUNTER — Ambulatory Visit: Payer: Medicaid Other | Admitting: Internal Medicine

## 2013-06-11 ENCOUNTER — Encounter: Payer: Self-pay | Admitting: *Deleted

## 2013-06-12 ENCOUNTER — Encounter: Payer: Medicaid Other | Admitting: Adult Health

## 2013-06-23 ENCOUNTER — Encounter: Payer: Medicaid Other | Admitting: Adult Health

## 2013-06-29 ENCOUNTER — Ambulatory Visit (INDEPENDENT_AMBULATORY_CARE_PROVIDER_SITE_OTHER): Payer: Medicaid Other | Admitting: Adult Health

## 2013-06-29 ENCOUNTER — Encounter: Payer: Self-pay | Admitting: Adult Health

## 2013-06-29 VITALS — BP 118/76 | HR 90 | Temp 97.2°F | Ht <= 58 in | Wt 134.2 lb

## 2013-06-29 DIAGNOSIS — J449 Chronic obstructive pulmonary disease, unspecified: Secondary | ICD-10-CM

## 2013-06-29 MED ORDER — AMOXICILLIN-POT CLAVULANATE 875-125 MG PO TABS: 1.0000 | ORAL_TABLET | Freq: Two times a day (BID) | ORAL | Status: AC

## 2013-06-29 MED ORDER — PREDNISONE 10 MG PO TABS: ORAL_TABLET | ORAL | Status: DC

## 2013-06-29 NOTE — Assessment & Plan Note (Addendum)
Flare  Patient's medications were reviewed today and patient education was given. Computerized medication calendar was adjusted/completed  Smoking cessation discussed   Plan  Augmentin 875mg   Twice daily  For 7 days  Prednisone taper over next week.  Must quit smoking .  Brush /rinse and gargle after use  May use Armour/hammer baking soda/peroxide toothpaste  Mucinex DM Twice daily  As needed  Cough/congestion  Follow med calendar closely and bring to each visit  Follow up Dr. Sherene Sires  In 3 months  and As needed

## 2013-06-29 NOTE — Patient Instructions (Signed)
Augmentin 875mg   Twice daily  For 7 days  Prednisone taper over next week.  Must quit smoking .  Brush /rinse and gargle after use  May use Armour/hammer baking soda/peroxide toothpaste  Mucinex DM Twice daily  As needed  Cough/congestion  Follow med calendar closely and bring to each visit  Follow up Dr. Sherene Sires  In 3 months  and As needed

## 2013-06-29 NOTE — Progress Notes (Signed)
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 May have helped some but did not help breathing or coughing severe to point of going to ER 2/9 no better on prednisone on this course, maybe better with ventolin last used 3 hours prior to OV . rec cycylical cough regimen Augmentin x 10 days If short of breath only use perforomist every 12 hours per neb   09/07/2011 f/u ov/Wert still smoking  Cough and sob Maybe better while on performist and worse when stopped it and no longer coughing up green stuff, only took pred a few days p ov then stopped.   rec Stop smoking completely - it's the most important aspect of your care! Mucinex dm 2 every every hours Symbicort 160 Take 2 puffs first thing in am and then another 2 puffs about 12 hours later.  - ok to use Prednisone 10 mg take  4 each am x 2 days,   2 each am x 2 days,  1 each am x2days and stop if not improving on symbicort Work on inhaler technique:   GERD diet Please schedule a follow up office visit in 2 weeks, sooner if needed with all medications including over the counters in hand.   10/12/2011 f/u ov/Wert cc Patient states cough is better since last visit. Patient also c/o chest tightness. Also states has cut back on smoking to about 3 a day.  No excess sputum or daytime need for saba, no limiting sob rec No change in your medications Completely stop smoking before  smoking completely stops you  Please schedule a follow up office visit in 6 weeks, call sooner if needed with pft's on return   11/29/2011 f/u ov/Wert  Still smoking cc much worse sob since April 20th severe cough >  Green mucus > amoxillicin and prednisone and cough syrup > no improvement at all.  AM of ov woke up at nl hour on day of ov and held all meds until ov for pft's. Does feel some better with albuterol hfa, no longer has neb albuterol.  No overt sinus or hb symptoms >>pred taper    01/24/2012 Follow up and med review  patient returns for a 6 week  followup for COPD She is brought all her medications and her review We reviewed all her medications and organized them into a medication calendar with patient education She has ran out [place] the inhaler Feels that she continues to have shortness of breath with activity  no increased cough or wheezing rec Restart Tudorza 1 puff Twice daily   Brush /rinse and gargle after use  May use Armour/hammer baking soda/peroxide toothpaste  Mycelex troches five times daily for 1 week for thrush Augmentin 875mg  Twice daily  For 7 days  Mucinex DM Twice daily  As needed  Cough/congestion  Follow med calendar closely  and bring to each visi   03/11/2012 Follow up COPD  Returns for 1 month follow up  Got some better but flared again , seen by PCP rx  augmentin and codeine cough syrup-for cough w green mucus last week--feels like she had since improved --spiriva inhaler started by Dr. Melvyn Novas  d/t insurance would not cover tudorza. Has not started spiriva yet.  Feeling better has few days of augmentin left.  No fever or edema  Still DOE and cough on/off .  rec Finish Augmentin as directed.  Continue on Spiriva and Symbicort > insurance required change  spiriva to atrovent  04/23/2012 f/u ov/Wert  Still smoking 2-3 cigs per day on symbicort/atrovent but tudorza has been approved and thinks it worked better.  [place]oe x one flight rec Please see patient  coordinator before you leave today  to schedule GI eval for bad heartburn Restart Caprice Renshaw one twice a day The key is to stop smoking completely before smoking completely stops you!  GERD diet  See calendar for specific medication instructions and bring it back for each and every office visit d  Late add: needs alpha one AT  Genotype next ov   03/16/2013 f/u ov/Wert stopped all meds March 2014 then downhill since then Chief Complaint  Patient presents with  . Follow-up    Pt states having increased SOB for the past 3 months.  She states has SOB with or without exertion. She has prod cough for the past 2 months-prod with green to yellow sputum.   still smoking but not daily. On acei with sensation of choking/throat irritation /sever cough not much better on aug/pred rx  >d/c ace , PPI /pepcid   04/07/2013 Follow up and med review  She is brought all her medications and her review We reviewed all her medications and organized them into a medication calendar with patient education It appears she is taking meds correctly.   Complains of  prod cough with thick green/yellow mucus, increased SOB, wheezing, chest tightness, hoarseness, some low grade temp x15 days Went to [facility] last week for hoarseness was  Informed throat was ok. But to come back for more detailed exam.  No hemoptysis, chest pain, orthopnea, edema.  Has lost 10 lbs over last year.  Last ov ACE was stopped , has not seen any change in hoarseness or cough.  Still smoking , cessation discussed.  rec Omnicef 300mg  Twice daily  For 7 days  Prednisone taper over next week.  Must quit smoking .  I will call with chest xray  And lab results.  Brush /rinse and gargle after use  May use Armour/hammer baking soda/peroxide toothpaste  Mucinex DM Twice daily  As needed  Cough/congestion  Follow med calendar closely and bring to each visit    04/24/2013 f/u ov/Wert did not bring med cal re: aecopd breathing  Bad since  2010    Chief Complaint  Patient presents with  . COPD    Breathing is getting worse. Saw TP on 03/16/13. Reports SOB, coughing with production of yellow mucus. Finished abx and pred taper with no relief.    Not even better with saba rec Prednisone 10 mg take  4 each am x 2 days,   2 each am x 2 days,  1 each am x 2 days and stop augmentin 875 twice daily x 10 days As per calendar:  For cough mucinex dm 2 every 12 hours and add flutter valve as much as you  can to help cough mucus up effectively     05/08/2013 f/u ov/Wert re: copd/ab still smoking, did not bring calendar but did bring most meds, not mucinex dm Chief Complaint  Patient presents with  . Follow-up    Pt states that her breathing had improved a great deal while on prednisone, and now is getting worse off of the medication. Her cough had also improved, but is now worse and is prod with minimal green sputum. She has been taking proair 2-3 times per day for the past wk.   >>pred taper   06/29/2013 Follow up and Med Review  Patient returns for a two-month followup and medication review. Patient medications reviewed and organized into a medication calendar with patient education. Appears that she is taking her medications correctly. Says that she has developed  Cough, and congestion. Over the last week. Patient, says she's had family members with similar symptoms. She complains of hoarseness, prod cough with yellow/thick green mucus, wheezing, increased SOB at times x1week.  She denies any hemoptysis, orthopnea, PND, or leg swelling.   Current Medications, Allergies, Complete Past Medical History, Past Surgical History, Family History, and Social History were reviewed in [employer] record.  ROS  The following are not active complaints unless bolded sore throat, dysphagia, dental problems, itching,  ear ache,   fever, chills, sweats, unintended wt loss, pleuritic or exertional cp, hemoptysis,  orthopnea pnd  or leg swelling, presyncope, palpitations, heartburn, abdominal pain, anorexia, nausea, vomiting, diarrhea  or change in bowel or urinary habits, change in stools or urine, dysuria,hematuria,  rash, arthralgias, visual complaints, headache, numbness weakness or ataxia or problems with walking or coordination,  change in mood/affect or memory.           Objective:   Physical Exam   08/23/2011  Wt 145 >   149 01/24/2012 >  144 03/11/2012 > 04/23/2012  142 > 03/16/2013  130 >04/07/2013 > 132 04/24/13 > 134 05/08/2013 >134 06/29/2013     HEENT mild turbinate edema.  Oropharynx clear     NECK:  Supple w/ fair ROM; no JVD; normal carotid impulses w/o bruits; no thyromegaly or nodules palpated; no lymphadenopathy.  RESP  Faint exp wheeze , no accessory muscle use, no dullness to percussion  CARD:  RRR, no m/r/g  , no peripheral edema, pulses intact, no cyanosis or clubbing.  GI:   Soft & nt; nml bowel sounds; no organomegaly or masses detected.  Musco: Warm bil, no deformities or joint swelling noted.   Neuro: alert, no focal deficits noted.    Skin: Warm, no lesions or rashes   04/08/13  Lung hyperexpansion without acute cardiopulmonary disease   Assessment & Plan:

## 2013-07-01 NOTE — Addendum Note (Signed)
Addended by: Boone Master E on: 07/01/2013 02:28 PM   Modules accepted: Orders

## 2013-07-22 ENCOUNTER — Ambulatory Visit (INDEPENDENT_AMBULATORY_CARE_PROVIDER_SITE_OTHER): Payer: Medicaid Other | Admitting: Internal Medicine

## 2013-07-22 ENCOUNTER — Encounter: Payer: Self-pay | Admitting: Internal Medicine

## 2013-07-22 VITALS — BP 128/80 | HR 115 | Temp 98.6°F | Ht 58.5 in | Wt 129.6 lb

## 2013-07-22 DIAGNOSIS — J449 Chronic obstructive pulmonary disease, unspecified: Secondary | ICD-10-CM

## 2013-07-22 DIAGNOSIS — F17200 Nicotine dependence, unspecified, uncomplicated: Secondary | ICD-10-CM

## 2013-07-22 MED ORDER — PREDNISONE 20 MG PO TABS: ORAL_TABLET | ORAL | Status: DC

## 2013-07-22 MED ORDER — ALPRAZOLAM 0.5 MG PO TABS: ORAL_TABLET | ORAL | Status: DC

## 2013-07-22 NOTE — Patient Instructions (Addendum)
Finish minocycline As per med calendar: 1) Prednisone 20 mg take one  per day until better and not needing nebulizer then reduce to one half daily until seen  2) for anxiety related to breathing use xanax 0.5 mg every 6 hours as needed   See Tammy NP w/in 2 weeks with all your medications, even over the counter meds, separated in two separate bags, the ones you take no matter what vs the ones you stop once you feel better and take only as needed when you feel you need them.   Tammy  will generate for you a new user friendly medication calendar that will put [place] all on the same page re: your medication use.

## 2013-07-22 NOTE — Progress Notes (Signed)
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 May have helped some but did not help breathing or coughing severe to point of going to ER 2/9 no better on prednisone on this course, maybe better with ventolin last used 3 hours prior to OV . rec cycylical cough regimen Augmentin x 10 days If short of breath only use perforomist every 12 hours per neb   09/07/2011 f/u ov/[name] still smoking  Cough and sob Maybe better while on performist and worse when stopped it and no longer coughing up green stuff, only took pred a few days p ov then stopped.   rec Stop smoking completely - it's the most important aspect of your care! Mucinex dm 2 every every hours Symbicort 160 Take 2 puffs first thing in am and then another 2 puffs about 12 hours later.  - ok to use Prednisone 10 mg take  4 each am x 2 days,   2 each am x 2 days,  1 each am x2days and stop if not improving on symbicort Work on inhaler technique:   GERD diet Please schedule a follow up office visit in 2 weeks, sooner if needed with all medications including over the counters in hand.   10/12/2011 f/u ov/[name] cc Patient states cough is better since last visit. Patient also c/o chest tightness. Also states has cut back on smoking to about 3 a day.  No excess sputum or daytime need for saba, no limiting sob rec No change in your medications Completely stop smoking before  smoking completely stops you  Please schedule a follow up office visit in 6 weeks, call sooner if needed with pft's on return   11/29/2011 f/u ov/[name]  Still smoking cc much worse sob since April 20th severe cough >  Green mucus > amoxillicin and prednisone and cough syrup > no improvement at all.  AM of ov woke up at nl hour on day of ov and held all meds until ov for pft's. Does feel some better with albuterol hfa, no longer has neb albuterol.  No overt sinus or hb symptoms >>pred taper    01/24/2012 Follow up and med review  patient returns for a 6 week  followup for COPD She is brought all her medications and her review We reviewed all her medications and organized them into a medication calendar with patient education She has ran out [place] the inhaler Feels that she continues to have shortness of breath with activity  no increased cough or wheezing rec Restart Tudorza 1 puff Twice daily   Brush /rinse and gargle after use  May use Armour/hammer baking soda/peroxide toothpaste  Mycelex troches five times daily for 1 week for thrush Augmentin 875mg  Twice daily  For 7 days  Mucinex DM Twice daily  As needed  Cough/congestion  Follow med calendar closely  and bring to each visi   03/11/2012 Follow up COPD  Returns for 1 month follow up  Got some better but flared again , seen by PCP rx  augmentin and codeine cough syrup-for cough w green mucus last week--feels like she had since improved --spiriva inhaler started by Dr. Melvyn Novas  d/t insurance would not cover tudorza. Has not started spiriva yet.  Feeling better has few days of augmentin left.  No fever or edema  Still DOE and cough on/off .  rec Finish Augmentin as directed.  Continue on Spiriva and Symbicort > insurance required change  spiriva to atrovent  04/23/2012 f/u ov/[name]  Still smoking 2-3 cigs per day on symbicort/atrovent but tudorza has been approved and thinks it worked better.  Doe x one flight rec Please see patient  coordinator before you leave today  to schedule GI eval for bad heartburn Restart Caprice Renshaw one twice a day The key is to stop smoking completely before smoking completely stops you!  GERD diet  See calendar for specific medication instructions and bring it back for each and every office visit d  Late add: needs alpha one AT  Genotype next ov   03/16/2013 f/u ov/[name] stopped all meds March 2014 then downhill since then Chief Complaint  Patient presents with  . Follow-up    Pt states having increased SOB for the past 3 months.  She states has SOB with or without exertion. She has prod cough for the past 2 months-prod with green to yellow sputum.   still smoking but not daily. On acei with sensation of choking/throat irritation /sever cough not much better on aug/pred rx  >d/c ace , PPI /pepcid   04/07/2013 Follow up and med review  She is brought all her medications and her review We reviewed all her medications and organized them into a medication calendar with patient education It appears she is taking meds correctly.   Complains of  prod cough with thick green/yellow mucus, increased SOB, wheezing, chest tightness, hoarseness, some low grade temp x15 days Went to [facility] last week for hoarseness was  Informed throat was ok. But to come back for more detailed exam.  No hemoptysis, chest pain, orthopnea, edema.  Has lost 10 lbs over last year.  Last ov ACE was stopped , has not seen any change in hoarseness or cough.  Still smoking , cessation discussed.  rec Omnicef 300mg  Twice daily  For 7 days  Prednisone taper over next week.  Must quit smoking .  I will call with chest xray  And lab results.  Brush /rinse and gargle after use  May use Armour/hammer baking soda/peroxide toothpaste  Mucinex DM Twice daily  As needed  Cough/congestion  Follow med calendar closely and bring to each visit    04/24/2013 f/u ov/[name] did not bring med cal re: aecopd breathing  Bad since  2010    Chief Complaint  Patient presents with  . COPD    Breathing is getting worse. Saw TP on 03/16/13. Reports SOB, coughing with production of yellow mucus. Finished abx and pred taper with no relief.    Not even better with saba rec Prednisone 10 mg take  4 each am x 2 days,   2 each am x 2 days,  1 each am x 2 days and stop augmentin 875 twice daily x 10 days As per calendar:  For cough mucinex dm 2 every 12 hours and add flutter valve as much as you  can to help cough mucus up effectively     05/08/2013 f/u ov/[name] re: copd/ab still smoking, did not bring calendar but did bring most meds, not mucinex dm Chief Complaint  Patient presents with  . Follow-up    Pt states that her breathing had improved a great deal while on prednisone, and now is getting worse off of the medication. Her cough had also improved, but is now worse and is prod with minimal green sputum. She has been taking proair 2-3 times per day for the past wk.   >>pred taper and return for med calenar  06/29/2013 Follow up and Med Review  Patient returns for a two-month followup and medication review. Patient medications reviewed and organized into a medication calendar with patient education. Appears that she is taking her medications correctly. Says that she has developed  Cough, and congestion. Over the last week. Patient, says she's had family members with similar symptoms. She complains of hoarseness, prod cough with yellow/thick green mucus, wheezing, increased SOB at times x1week.  rec Augmentin 875mg   Twice daily  For 7 days  Prednisone taper over next week.  Must quit smoking .  Brush /rinse and gargle after use  May use Armour/hammer baking soda/peroxide toothpaste  Mucinex DM Twice daily  As needed  Cough/congestion   07/22/2013 acute  ov/[name] re: aecopd, still smoking Chief Complaint  Patient presents with  . Acute Visit    Still having sob, wheezing, chest tightness and cough with green mucus x  3weeks and worsening  contact with another sick member of fm since last flare but never cleared up from the prev ov.  rx already by [facility] with minocyclin and prednisone since 07/18/13  Using proair up to twice daily then abandoned in favor of neb  No obvious day to day or daytime variabilty or assoc chronic cough or cp or chest tightness, subjective wheeze overt sinus or hb symptoms. No unusual exp hx or h/o childhood pna/ asthma or knowledge of premature birth.  Sleeping ok without nocturnal  or early am exacerbation  of respiratory  c/o's or need for noct saba. Also denies any obvious fluctuation of symptoms with weather or environmental changes or other aggravating or alleviating factors except as outlined above   Current Medications, Allergies, Complete Past Medical History, Past Surgical History, Family History, and Social History were reviewed in [employer] record.  ROS  The following are not active complaints unless bolded sore throat, dysphagia, dental problems, itching, sneezing,  nasal congestion or excess/ purulent secretions, ear ache,   fever, chills, sweats, unintended wt loss, pleuritic or exertional cp, hemoptysis,  orthopnea pnd or leg swelling, presyncope, palpitations, heartburn, abdominal pain, anorexia, nausea, vomiting, diarrhea  or change in bowel or urinary habits, change in stools or urine, dysuria,hematuria,  rash, arthralgias, visual complaints, headache, numbness weakness or ataxia or problems with walking or coordination,  change in mood/affect or memory.     .           Objective:   Physical Exam   08/23/2011  Wt 145 >   149 01/24/2012 >  144 03/11/2012 > 04/23/2012  142 > 03/16/2013  130 >04/07/2013 > 132 04/24/13 > 134 05/08/2013 >134 06/29/2013 > 07/22/2013 130   Extremely anxious wf talking in full sentences even when given neb in office (much of it ran out before she took time to inhale it)     HEENT mild turbinate edema.  Oropharynx  clear  NECK:  Supple w/ fair ROM; no JVD; normal carotid impulses w/o bruits; no thyromegaly or nodules palpated; no lymphadenopathy.  RESP  Faint mid bilaterat exp wheeze , no accessory muscle use, no dullness to percussion  CARD:  RRR, no m/r/g  , no peripheral edema, pulses intact, no cyanosis or clubbing.  GI:   Soft & nt; nml bowel sounds; no organomegaly or masses detected.  Musco: Warm bil, no deformities or joint swelling noted.   Neuro: alert, no focal deficits noted.    Skin: Warm, no lesions or rashes   04/08/13  Lung hyperexpansion without acute cardiopulmonary disease   Assessment & Plan:

## 2013-07-23 NOTE — Assessment & Plan Note (Signed)

## 2013-07-23 NOTE — Assessment & Plan Note (Signed)
-   PFT's 11/29/2011 FEV1  1.08 (46%) ratio 43 and 15 % better p B2   - HFA 75% 11/29/2011 > added Tudorza>changed to spiriva /insurance > changed back to tudorza 04/23/2012    - HFA 75% 04/24/2013  > 07/22/2013 p extensive coaching HFA effectiveness =    75% -med calendar 04/07/2013  -alpha 1 04/07/2013 >175 MM    DDX of  difficult airways managment all start with A and  include Adherence, Ace Inhibitors, Acid Reflux, Active Sinus Disease, Alpha 1 Antitripsin deficiency, Anxiety masquerading as Airways dz,  ABPA,  allergy(esp in young), Aspiration (esp in elderly), Adverse effects of DPI,  Active smokers, plus two Bs  = Bronchiectasis and Beta blocker use..and one C= CHF  Adherence is always the initial "prime suspect" and is a multilayered concern that requires a "trust but verify" approach in every patient - starting with knowing how to use medications, especially inhalers, correctly, keeping up with refills and understanding the fundamental difference between maintenance and prns vs those medications only taken for a very short course and then stopped and not refilled.  - doing better with med calendar use/ action plan reviewed re saba hfa vs neb - The proper method of use, as well as anticipated side effects, of a metered-dose inhaler are discussed and demonstrated to the patient. Improved effectiveness after extensive coaching during this visit to a level of approximately  75%   Active smoking greatest concern > discussed separately  Anxietry > usually dx of exclusion but much higher on her list > try xanax 0.5 mg every 6 h prn  ? Allergy/ asthma >  ? pred responsive. The goal with a chronic steroid dependent illness is always arriving at the lowest effective dose that controls the disease/symptoms and not accepting a set "formula" which is based on statistics or guidelines that don't always take into account patient  variability or the natural hx of the dz in every individual patient, which may  well vary over time.  For now therefore I recommend the patient maintain  A ceiling of 20 mg and a floor of 10 mg per day based on need for neb = use the 20 and no need for neb = 10

## 2013-08-06 ENCOUNTER — Ambulatory Visit (INDEPENDENT_AMBULATORY_CARE_PROVIDER_SITE_OTHER)
Admission: RE | Admit: 2013-08-06 | Discharge: 2013-08-06 | Disposition: A | Discharge status: Home / Self Care | Payer: Medicaid Other | Source: Ambulatory Visit | Attending: Adult Health | Admitting: Adult Health

## 2013-08-06 ENCOUNTER — Telehealth: Payer: Self-pay | Admitting: Adult Health

## 2013-08-06 ENCOUNTER — Other Ambulatory Visit (INDEPENDENT_AMBULATORY_CARE_PROVIDER_SITE_OTHER): Payer: Medicaid Other

## 2013-08-06 ENCOUNTER — Encounter: Payer: Self-pay | Admitting: Adult Health

## 2013-08-06 ENCOUNTER — Ambulatory Visit (INDEPENDENT_AMBULATORY_CARE_PROVIDER_SITE_OTHER): Payer: Medicaid Other | Admitting: Adult Health

## 2013-08-06 VITALS — BP 154/86 | HR 108 | Temp 98.8°F | Ht 58.5 in | Wt 132.8 lb

## 2013-08-06 DIAGNOSIS — J449 Chronic obstructive pulmonary disease, unspecified: Secondary | ICD-10-CM

## 2013-08-06 DIAGNOSIS — J4489 Other specified chronic obstructive pulmonary disease: Secondary | ICD-10-CM

## 2013-08-06 LAB — CBC WITH DIFFERENTIAL/PLATELET
Basophils Absolute: 0 10*3/uL (ref 0.0–0.1)
Basophils Relative: 0.4 % (ref 0.0–3.0)
Eosinophils Absolute: 0 10*3/uL (ref 0.0–0.7)
Eosinophils Relative: 0.1 % (ref 0.0–5.0)
HEMATOCRIT: 45.9 % (ref 36.0–46.0)
HEMOGLOBIN: 15.2 g/dL — AB (ref 12.0–15.0)
LYMPHS ABS: 1.1 10*3/uL (ref 0.7–4.0)
Lymphocytes Relative: 8.3 % — ABNORMAL LOW (ref 12.0–46.0)
MCHC: 33.1 g/dL (ref 30.0–36.0)
MCV: 101.9 fl — ABNORMAL HIGH (ref 78.0–100.0)
Monocytes Absolute: 0.1 10*3/uL (ref 0.1–1.0)
Monocytes Relative: 0.5 % — ABNORMAL LOW (ref 3.0–12.0)
Neutro Abs: 11.7 10*3/uL — ABNORMAL HIGH (ref 1.4–7.7)
Neutrophils Relative %: 90.7 % — ABNORMAL HIGH (ref 43.0–77.0)
PLATELETS: 276 10*3/uL (ref 150.0–400.0)
RBC: 4.51 Mil/uL (ref 3.87–5.11)
RDW: 13 % (ref 11.5–14.6)
WBC: 12.9 10*3/uL — AB (ref 4.5–10.5)

## 2013-08-06 LAB — BASIC METABOLIC PANEL
BUN: 12 mg/dL (ref 6–23)
CALCIUM: 9.3 mg/dL (ref 8.4–10.5)
CO2: 33 mEq/L — ABNORMAL HIGH (ref 19–32)
Chloride: 97 mEq/L (ref 96–112)
Creatinine, Ser: 0.8 mg/dL (ref 0.4–1.2)
GFR: 82.64 mL/min (ref >60.00)
GLUCOSE: 121 mg/dL — AB (ref 70–99)
POTASSIUM: 4.7 meq/L (ref 3.5–5.1)
Sodium: 137 mEq/L (ref 135–145)

## 2013-08-06 LAB — BRAIN NATRIURETIC PEPTIDE: Pro B Natriuretic peptide (BNP): 40 pg/mL (ref 0.0–100.0)

## 2013-08-06 MED ORDER — FLUCONAZOLE 100 MG PO TABS: 100.0000 mg | ORAL_TABLET | Freq: Every day | ORAL | Status: AC

## 2013-08-06 NOTE — Progress Notes (Signed)
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 May have helped some but did not help breathing or coughing severe to point of going to ER 2/9 no better on prednisone on this course, maybe better with ventolin last used 3 hours prior to OV . rec cycylical cough regimen Augmentin x 10 days If short of breath only use perforomist every 12 hours per neb   09/07/2011 f/u ov/Wert still smoking  Cough and sob Maybe better while on performist and worse when stopped it and no longer coughing up green stuff, only took pred a few days p ov then stopped.   rec Stop smoking completely - it's the most important aspect of your care! Mucinex dm 2 every every hours Symbicort 160 Take 2 puffs first thing in am and then another 2 puffs about 12 hours later.  - ok to use Prednisone 10 mg take  4 each am x 2 days,   2 each am x 2 days,  1 each am x2days and stop if not improving on symbicort Work on inhaler technique:   GERD diet Please schedule a follow up office visit in 2 weeks, sooner if needed with all medications including over the counters in hand.   10/12/2011 f/u ov/Wert cc Patient states cough is better since last visit. Patient also c/o chest tightness. Also states has cut back on smoking to about 3 a day.  No excess sputum or daytime need for saba, no limiting sob rec No change in your medications Completely stop smoking before  smoking completely stops you  Please schedule a follow up office visit in 6 weeks, call sooner if needed with pft's on return   11/29/2011 f/u ov/Wert  Still smoking cc much worse sob since April 20th severe cough >  Green mucus > amoxillicin and prednisone and cough syrup > no improvement at all.  AM of ov woke up at nl hour on day of ov and held all meds until ov for pft's. Does feel some better with albuterol hfa, no longer has neb albuterol.  No overt sinus or hb symptoms >>pred taper    01/24/2012 Follow up and med review  patient returns for a 6 week  followup for COPD She is brought all her medications and her review We reviewed all her medications and organized them into a medication calendar with patient education She has ran out [place] the inhaler Feels that she continues to have shortness of breath with activity  no increased cough or wheezing rec Restart Tudorza 1 puff Twice daily   Brush /rinse and gargle after use  May use Armour/hammer baking soda/peroxide toothpaste  Mycelex troches five times daily for 1 week for thrush Augmentin 875mg  Twice daily  For 7 days  Mucinex DM Twice daily  As needed  Cough/congestion  Follow med calendar closely  and bring to each visi   03/11/2012 Follow up COPD  Returns for 1 month follow up  Got some better but flared again , seen by PCP rx  augmentin and codeine cough syrup-for cough w green mucus last week--feels like she had since improved --spiriva inhaler started by Dr. Melvyn Novas  d/t insurance would not cover tudorza. Has not started spiriva yet.  Feeling better has few days of augmentin left.  No fever or edema  Still DOE and cough on/off .  rec Finish Augmentin as directed.  Continue on Spiriva and Symbicort > insurance required change  spiriva to atrovent  04/23/2012 f/u ov/Wert  Still smoking 2-3 cigs per day on symbicort/atrovent but tudorza has been approved and thinks it worked better.  Doe x one flight rec Please see patient  coordinator before you leave today  to schedule GI eval for bad heartburn Restart Caprice Renshaw one twice a day The key is to stop smoking completely before smoking completely stops you!  GERD diet  See calendar for specific medication instructions and bring it back for each and every office visit d  Late add: needs alpha one AT  Genotype next ov   03/16/2013 f/u ov/Wert stopped all meds March 2014 then downhill since then Chief Complaint  Patient presents with  . Follow-up    Pt states having increased SOB for the past 3 months.  She states has SOB with or without exertion. She has prod cough for the past 2 months-prod with green to yellow sputum.   still smoking but not daily. On acei with sensation of choking/throat irritation /sever cough not much better on aug/pred rx  >d/c ace , PPI /pepcid   04/07/2013 Follow up and med review  She is brought all her medications and her review We reviewed all her medications and organized them into a medication calendar with patient education It appears she is taking meds correctly.   Complains of  prod cough with thick green/yellow mucus, increased SOB, wheezing, chest tightness, hoarseness, some low grade temp x15 days Went to [facility] last week for hoarseness was  Informed throat was ok. But to come back for more detailed exam.  No hemoptysis, chest pain, orthopnea, edema.  Has lost 10 lbs over last year.  Last ov ACE was stopped , has not seen any change in hoarseness or cough.  Still smoking , cessation discussed.  rec Omnicef 300mg  Twice daily  For 7 days  Prednisone taper over next week.  Must quit smoking .  I will call with chest xray  And lab results.  Brush /rinse and gargle after use  May use Armour/hammer baking soda/peroxide toothpaste  Mucinex DM Twice daily  As needed  Cough/congestion  Follow med calendar closely and bring to each visit    04/24/2013 f/u ov/Wert did not bring med cal re: aecopd breathing  Bad since  2010    Chief Complaint  Patient presents with  . COPD    Breathing is getting worse. Saw TP on 03/16/13. Reports SOB, coughing with production of yellow mucus. Finished abx and pred taper with no relief.    Not even better with saba rec Prednisone 10 mg take  4 each am x 2 days,   2 each am x 2 days,  1 each am x 2 days and stop augmentin 875 twice daily x 10 days As per calendar:  For cough mucinex dm 2 every 12 hours and add flutter valve as much as you  can to help cough mucus up effectively     05/08/2013 f/u ov/Wert re: copd/ab still smoking, did not bring calendar but did bring most meds, not mucinex dm Chief Complaint  Patient presents with  . Follow-up    Pt states that her breathing had improved a great deal while on prednisone, and now is getting worse off of the medication. Her cough had also improved, but is now worse and is prod with minimal green sputum. She has been taking proair 2-3 times per day for the past wk.   >>pred taper and return for med calenar  06/29/2013 Follow up and Med Review  Patient returns for a two-month followup and medication review. Patient medications reviewed and organized into a medication calendar with patient education. Appears that she is taking her medications correctly. Says that she has developed  Cough, and congestion. Over the last week. Patient, says she's had family members with similar symptoms. She complains of hoarseness, prod cough with yellow/thick green mucus, wheezing, increased SOB at times x1week.  rec Augmentin 875mg   Twice daily  For 7 days  Prednisone taper over next week.  Must quit smoking .  Brush /rinse and gargle after use  May use Armour/hammer baking soda/peroxide toothpaste  Mucinex DM Twice daily  As needed  Cough/congestion   07/22/2013 acute  ov/Wert re: aecopd, still smoking Chief Complaint  Patient presents with  . Acute Visit    Still having sob, wheezing, chest tightness and cough with green mucus x  3weeks and worsening  contact with another sick member of fm since last flare but never cleared up from the prev ov.  rx already by [facility] with minocyclin and prednisone since 07/18/13  Using proair up to twice daily then abandoned in favor of neb  >>pred burst/maintenence , xanax rx for anxiety  and mso4 for severe cough   08/06/2013 Follow up an med review  Pt returns for follow up and med review  Patient medications reviewed and organized into a medication calendar with patient education. Appears that she is taking her medications correctly. Had COPD flare last ov , tx w/ abx and steroids  Says she was feeling no better. Went back to PCP last week, started on zpack -last dose is tomorrow.  Still has thrush.  Currently on prednisone 20mg  daily  No hemoptysis , chest pain., orthopnea, or fever.  Has a lot of anxiety, stress at home. Feels anxiety makes her worse.  Husband with her today.   Current Medications, Allergies, Complete Past Medical History, Past Surgical History, Family History, and Social History were reviewed in [employer] record.  ROS  The following are not active complaints unless bolded sore throat, dysphagia, dental problems, itching, sneezing,  nasal congestion or excess/ purulent secretions, ear ache,   fever, chills, sweats, unintended wt loss, pleuritic or exertional cp, hemoptysis,  orthopnea pnd or leg swelling, presyncope, palpitations, heartburn, abdominal pain, anorexia, nausea, vomiting, diarrhea  or change in bowel or urinary habits, change in stools or urine, dysuria,hematuria,  rash, arthralgias, visual complaints, headache, numbness weakness or ataxia or problems with walking or coordination.           Objective:   Physical Exam   08/23/2011  Wt 145 >   149 01/24/2012 >  144 03/11/2012 > 04/23/2012  142 > 03/16/2013  130 >04/07/2013 > 132 04/24/13 > 134 05/08/2013 >134 06/29/2013 > 07/22/2013 130  08/06/2013    NAD    HEENT mild  turbinate edema.  Oropharynx clear     NECK:  Supple w/ fair ROM; no JVD; normal carotid impulses w/o bruits; no thyromegaly or nodules palpated; no lymphadenopathy.  RESP  Faint mid bilaterat exp wheeze , no accessory muscle use, no dullness to percussion  CARD:  RRR, no m/r/g  , no peripheral edema, pulses intact, no cyanosis or clubbing.  GI:   Soft & nt; nml bowel sounds; no organomegaly or masses detected.  Musco: Warm bil, no deformities or joint swelling noted.   Neuro: alert, no focal deficits noted.    Skin: Warm, no lesions or rashes   04/08/13  Lung hyperexpansion without acute cardiopulmonary disease   Assessment & Plan:

## 2013-08-06 NOTE — Patient Instructions (Addendum)
Must quit smoking .  Remain on Prednisone 20mg  until back to your baseline then back to 10mg   Xray and labs today.  Diflucan 100mg  2 first day then 1 daily until gone.  Follow med calendar closely and bring to each visit  Follow up Dr. Melvyn Novas  In 3-4 weeks and As needed    Please contact office for sooner follow up if symptoms do not improve or worsen or seek emergency care

## 2013-08-06 NOTE — Assessment & Plan Note (Addendum)
Slow to resolve exacerbations in active smoker  Complicated by anxiety and oral candidiasis.  Inhaler education given  Pt education on steroids given Advised on dangers of multiple sedating rx use.  Advised pt and husband on need for counseling due to stress/anxiety and depression  rec on pulmonary rehab referral -when more stable  No further abx at this time , check cxr , bnp and cbc today  Smoking cessation discussed   Plan  Must quit smoking .  Remain on Prednisone 20mg  until back to your baseline then back to 10mg   Xray and labs today.  Diflucan 100mg  2 first day then 1 daily until gone.  Follow med calendar closely and bring to each visit  Follow up Dr. Melvyn Novas  In 3-4 weeks and As needed    Please contact office for sooner follow up if symptoms do not improve or worsen or seek emergency care

## 2013-08-06 NOTE — Telephone Encounter (Signed)
lmomtcb x1 for pt 

## 2013-08-07 MED ORDER — NICOTINE 10 MG IN INHA: 1.0000 | RESPIRATORY_TRACT | Status: DC | PRN

## 2013-08-07 NOTE — Telephone Encounter (Signed)
lmomtcb x 2  

## 2013-08-07 NOTE — Telephone Encounter (Signed)
Per TP: ok to send Nicotrol inhaler.  Rx sent to pharmacy. Called spoke with patient to inform her of the above.  Pt verbalized her understanding.  However, she would also like to know if TP thinks that she needs another antibiotic (pt is aware that per her AVS, an abx was not recommended).  Will discuss with TP and call pt back with recommendations.

## 2013-08-07 NOTE — Telephone Encounter (Signed)
Per TP: no abx needed at this time as pt just finished 2 rounds.  Recommend mucinex dm twice daily as needed, delsym as needed for cough.  Thanks.

## 2013-08-07 NOTE — Telephone Encounter (Signed)
Pt calling again wants to ask the nurse about prescribing some antibiotics.[name]

## 2013-08-07 NOTE — Telephone Encounter (Signed)
I called and made pt aware of recs. Nothing further needed 

## 2013-08-07 NOTE — Telephone Encounter (Signed)
Called and spoke with pt again. She reports her PCP RX'd her ZPAK-not helping. Pt reports she is still coughing up yellow phlem and is having thrush currently. She wants to if she would benefit from another round of minocycline. Please advise TP thanks

## 2013-08-07 NOTE — Telephone Encounter (Signed)
Pt called back. She reports during the appt TP had mentioned about the nicotrol inhaler since it is important for her to stop smoking. Pt also aware of results. Please advise TP thanks

## 2013-08-14 ENCOUNTER — Telehealth: Payer: Self-pay | Admitting: Adult Health

## 2013-08-14 NOTE — Telephone Encounter (Signed)
Called [facility]. Was advised this needs PA. Will await fax to triage.  Called [phone] Id # [id number] L Spoke with Upma from [place] tracks. Was advised pt must try and fail chantix or Wellbutrin. Please advise TP thanks

## 2013-08-14 NOTE — Telephone Encounter (Signed)
Called and spoke with pt and she stated that she has tried wellbutrin back in the 1990's and this caused severe nervousness.  mindy called this morning for the PA paper to be faxed to the triage printer.  This has not been received yet.  Pt is aware that we will contact her once this has been approved or denied.  Will forward to [place] to see if these papers are in her look at.

## 2013-08-14 NOTE — Telephone Encounter (Signed)
RX was called on 08/07/13 for nicotrol inhaler.   Called # listed and phone rings several times and no answer wcb

## 2013-08-14 NOTE — Telephone Encounter (Signed)
Will have to check with pt has she tried these in past

## 2013-08-14 NOTE — Telephone Encounter (Signed)
lmtcb for pt x1 

## 2013-08-17 NOTE — Telephone Encounter (Signed)
Still have not received form from pt's insurance regarding PA for Nicotrol If not received tomorrow, will call

## 2013-08-27 NOTE — Telephone Encounter (Signed)
#   for PA is listed below in the phone note. I called and spoke with  Abigail Butts from [facility] tracks This was approved. Approval # [id number] P LMTCB x1 for pt [facility] is aware

## 2013-08-27 NOTE — Telephone Encounter (Signed)
Patient returning call.  Please leave message of voicemail.

## 2013-08-27 NOTE — Telephone Encounter (Signed)
Left detailed msg for the pt informing her that the rx was approved per pt's request

## 2013-08-28 ENCOUNTER — Other Ambulatory Visit: Payer: Self-pay | Admitting: Internal Medicine

## 2013-08-28 ENCOUNTER — Ambulatory Visit (INDEPENDENT_AMBULATORY_CARE_PROVIDER_SITE_OTHER): Payer: Medicaid Other | Admitting: Internal Medicine

## 2013-08-28 ENCOUNTER — Encounter: Payer: Self-pay | Admitting: Internal Medicine

## 2013-08-28 VITALS — BP 114/66 | HR 109 | Temp 97.6°F | Ht 58.5 in | Wt 135.4 lb

## 2013-08-28 DIAGNOSIS — F17200 Nicotine dependence, unspecified, uncomplicated: Secondary | ICD-10-CM

## 2013-08-28 DIAGNOSIS — J449 Chronic obstructive pulmonary disease, unspecified: Secondary | ICD-10-CM

## 2013-08-28 MED ORDER — AMOXICILLIN-POT CLAVULANATE 875-125 MG PO TABS: 1.0000 | ORAL_TABLET | Freq: Two times a day (BID) | ORAL | Status: DC

## 2013-08-28 NOTE — Progress Notes (Signed)
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 May have helped some but did not help breathing or coughing severe to point of going to ER 2/9 no better on prednisone on this course, maybe better with ventolin last used 3 hours prior to OV . rec cycylical cough regimen Augmentin x 10 days If short of breath only use perforomist every 12 hours per neb   09/07/2011 f/u ov/[name] still smoking  Cough and sob Maybe better while on performist and worse when stopped it and no longer coughing up green stuff, only took pred a few days p ov then stopped.   rec Stop smoking completely - it's the most important aspect of your care! Mucinex dm 2 every every hours Symbicort 160 Take 2 puffs first thing in am and then another 2 puffs about 12 hours later.  - ok to use Prednisone 10 mg take  4 each am x 2 days,   2 each am x 2 days,  1 each am x2days and stop if not improving on symbicort Work on inhaler technique:   GERD diet Please schedule a follow up office visit in 2 weeks, sooner if needed with all medications including over the counters in hand.   10/12/2011 f/u ov/[name] cc Patient states cough is better since last visit. Patient also c/o chest tightness. Also states has cut back on smoking to about 3 a day.  No excess sputum or daytime need for saba, no limiting sob rec No change in your medications Completely stop smoking before  smoking completely stops you  Please schedule a follow up office visit in 6 weeks, call sooner if needed with pft's on return   11/29/2011 f/u ov/[name]  Still smoking cc much worse sob since April 20th severe cough >  Green mucus > amoxillicin and prednisone and cough syrup > no improvement at all.  AM of ov woke up at nl hour on day of ov and held all meds until ov for pft's. Does feel some better with albuterol hfa, no longer has neb albuterol.  No overt sinus or hb symptoms >>pred taper    01/24/2012 Follow up and med review  patient returns for a 6 week  followup for COPD She is brought all her medications and her review We reviewed all her medications and organized them into a medication calendar with patient education She has ran out [place] the inhaler Feels that she continues to have shortness of breath with activity  no increased cough or wheezing rec Restart Tudorza 1 puff Twice daily   Brush /rinse and gargle after use  May use Armour/hammer baking soda/peroxide toothpaste  Mycelex troches five times daily for 1 week for thrush Augmentin 875mg  Twice daily  For 7 days  Mucinex DM Twice daily  As needed  Cough/congestion  Follow med calendar closely  and bring to each visi   03/11/2012 Follow up COPD  Returns for 1 month follow up  Got some better but flared again , seen by PCP rx  augmentin and codeine cough syrup-for cough w green mucus last week--feels like she had since improved --spiriva inhaler started by Dr. Melvyn Novas  d/t insurance would not cover tudorza. Has not started spiriva yet.  Feeling better has few days of augmentin left.  No fever or edema  Still DOE and cough on/off .  rec Finish Augmentin as directed.  Continue on Spiriva and Symbicort > insurance required change  spiriva to atrovent  04/23/2012 f/u ov/[name]  Still smoking 2-3 cigs per day on symbicort/atrovent but tudorza has been approved and thinks it worked better.  Doe x one flight rec Please see patient  coordinator before you leave today  to schedule GI eval for bad heartburn Restart Caprice Renshaw one twice a day The key is to stop smoking completely before smoking completely stops you!  GERD diet  See calendar for specific medication instructions and bring it back for each and every office visit d  Late add: needs alpha one AT  [facility] next ov   03/16/2013 f/u ov/[name] stopped all meds March 2014 then downhill since then Chief Complaint  Patient presents with  . Follow-up    Pt states having increased SOB for the past 3 months.  She states has SOB with or without exertion. She has prod cough for the past 2 months-prod with green to yellow sputum.   still smoking but not daily. On acei with sensation of choking/throat irritation /sever cough not much better on aug/pred rx  >d/c ace , PPI /pepcid   04/07/2013 Follow up and med review  She is brought all her medications and her review We reviewed all her medications and organized them into a medication calendar with patient education It appears she is taking meds correctly.   Complains of  prod cough with thick green/yellow mucus, increased SOB, wheezing, chest tightness, hoarseness, some low grade temp x15 days Went to [facility] last week for hoarseness was  Informed throat was ok. But to come back for more detailed exam.  No hemoptysis, chest pain, orthopnea, edema.  Has lost 10 lbs over last year.  Last ov ACE was stopped , has not seen any change in hoarseness or cough.  Still smoking , cessation discussed.  rec Omnicef 300mg  Twice daily  For 7 days  Prednisone taper over next week.  Must quit smoking .  I will call with chest xray  And lab results.  Brush /rinse and gargle after use  May use Armour/hammer baking soda/peroxide toothpaste  Mucinex DM Twice daily  As needed  Cough/congestion  Follow med calendar closely and bring to each visit    04/24/2013 f/u ov/[name] did not bring med cal re: aecopd breathing  Bad since  2010    Chief Complaint  Patient presents with  . COPD    Breathing is getting worse. Saw TP on 03/16/13. Reports SOB, coughing with production of yellow mucus. Finished abx and pred taper with no relief.    Not even better with saba rec Prednisone 10 mg take  4 each am x 2 days,   2 each am x 2 days,  1 each am x 2 days and stop augmentin 875 twice daily x 10 days As per calendar:  For cough mucinex dm 2 every 12 hours and add flutter valve as much as you  can to help cough mucus up effectively     05/08/2013 f/u ov/[name] re: copd/ab still smoking, did not bring calendar but did bring most meds, not mucinex dm Chief Complaint  Patient presents with  . Follow-up    Pt states that her breathing had improved a great deal while on prednisone, and now is getting worse off of the medication. Her cough had also improved, but is now worse and is prod with minimal green sputum. She has been taking proair 2-3 times per day for the past wk.   >>pred taper and return for med calenar  06/29/2013 Follow up and Med Review  Patient returns for a two-month followup and medication review. Patient medications reviewed and organized into a medication calendar with patient education. Appears that she is taking her medications correctly. Says that she has developed  Cough, and congestion. Over the last week. Patient, says she's had family members with similar symptoms. She complains of hoarseness, prod cough with yellow/thick green mucus, wheezing, increased SOB at times x1week.  rec Augmentin 875mg   Twice daily  For 7 days  Prednisone taper over next week.  Must quit smoking .  Brush /rinse and gargle after use  May use Armour/hammer baking soda/peroxide toothpaste  Mucinex DM Twice daily  As needed  Cough/congestion   07/22/2013 acute  ov/[name] re: aecopd, still smoking Chief Complaint  Patient presents with  . Acute Visit    Still having sob, wheezing, chest tightness and cough with green mucus x  3weeks and worsening  contact with another sick member of fm since last flare but never cleared up from the prev ov.  rx already by [facility] with minocyclin and prednisone since 07/18/13  Using proair up to twice daily then abandoned in favor of neb  >>pred burst/maintenence , xanax rx for anxiety  and mso4 for severe cough   08/06/2013 Follow up an med review  Pt returns for follow up and med review  Patient medications reviewed and organized into a medication calendar with patient education. Appears that she is taking her medications correctly. Had COPD flare last ov , tx w/ abx and steroids  Says she was feeling no better. Went back to PCP last week, started on zpack -last dose is tomorrow.  Still has thrush.  Currently on prednisone 20mg  daily  No hemoptysis , chest pain., orthopnea, or fever.  Has a lot of anxiety, stress at home. Feels anxiety makes her worse.  Husband with her today rec Must quit smoking .  Remain on Prednisone 20mg  until back to your baseline then back to 10mg   Xray and labs today.  Diflucan 100mg  2 first day then 1 daily until gone.  Follow med calendar closely and bring to each visit    08/28/2013 f/u ov/[name] re: worse cough x 2 months/ still smoking / using med calendar / on 20 mg prednisone Chief Complaint  Patient presents with  . Follow-up    Pt still SOB, prod cough with green mucous.     coughing greeen since Xmas esp in am, some better p saba hfa but insists on using the neb first.  No obvious day to day or daytime variabilty or assoc chronic cough or cp or chest tightness, subjective wheeze overt sinus or hb symptoms. No unusual exp hx or h/o childhood pna/ asthma or knowledge of premature birth.  Sleeping ok without nocturnal  or early am exacerbation  of respiratory  c/o's or need for noct saba. Also denies any obvious  fluctuation of symptoms with weather or environmental changes or other aggravating or alleviating factors except as outlined above    Current Medications, Allergies, Complete Past Medical History, Past Surgical History, Family History, and Social History were reviewed in [employer] record.  ROS  The following are not active complaints unless bolded sore throat, dysphagia, dental problems, itching, sneezing,  nasal congestion or excess/ purulent secretions, ear ache,   fever, chills, sweats, unintended wt loss, pleuritic or exertional cp, hemoptysis,  orthopnea pnd or leg swelling, presyncope, palpitations, heartburn, abdominal pain, anorexia, nausea, vomiting, diarrhea  or change in bowel or urinary habits, change in stools or urine, dysuria,hematuria,  rash, arthralgias, visual complaints, headache, numbness weakness or ataxia or problems with walking or coordination,  change in mood/affect or memory.           Objective:   Physical Exam   08/23/2011  Wt 145 >   149 01/24/2012 >  144 03/11/2012 > 04/23/2012  142 > 03/16/2013  130 >04/07/2013 > 132 04/24/13 > 134 05/08/2013 >134 06/29/2013 > 07/22/2013 130 > 08/28/2013 135     NAD    HEENT mild turbinate edema.  Oropharynx clear     NECK:  Supple w/ fair ROM; no JVD; normal carotid impulses w/o bruits; no thyromegaly or nodules palpated; no lymphadenopathy.  RESP  Faint mid bilaterat exp wheeze , no accessory muscle use, no dullness to percussion  CARD:  RRR, no m/r/g  , no peripheral edema, pulses intact, no cyanosis or clubbing.  GI:   Soft & nt; nml bowel sounds; no organomegaly or masses detected.  Musco: Warm bil, no deformities or joint swelling noted.   Neuro: alert, no focal deficits noted.    Skin: Warm, no lesions or rashes   08/06/13 CXR No active cardiopulmonary disease.       Assessment & Plan:

## 2013-08-28 NOTE — Patient Instructions (Addendum)
Augmentin 875 mg take one pill twice daily  X 10 days - take at breakfast and supper with large glass of water.  It would help reduce the usual side effects (diarrhea and yeast infections) if you ate cultured yogurt at lunch.   See calendar for specific medication instructions and bring it back for each and every office visit for every healthcare provider you see.  Without it,  you may not receive the best quality medical care that we feel you deserve.  You will note that the calendar groups together  your maintenance  medications that are timed at particular times of the day.  Think of this as your checklist for what your doctor has instructed you to do until your next evaluation to see what benefit  there is  to staying on a consistent group of medications intended to keep you well.  The other group at the bottom is entirely up to you to use as you see fit  for specific symptoms that may arise between visits that require you to treat them on an as needed basis.  Think of this as your action plan or "what if" list.   Separating the top medications from the bottom group is fundamental to providing you adequate care going forward.   The key is to stop smoking completely before smoking completely stops you!   Please schedule a follow up office visit in 4 weeks, call sooner if needed bring all medications and your med sheet. symbicort count 118 / ti too short/ pfts with me after recheck meds

## 2013-08-29 NOTE — Assessment & Plan Note (Signed)

## 2013-08-29 NOTE — Assessment & Plan Note (Addendum)
-   PFT's 11/29/2011 FEV1  1.08 (46%) ratio 43 and 15 % better p B2   - HFA 75% 11/29/2011 > added Tudorza>changed to spiriva /insurance > changed back to tudorza 04/23/2012    -alpha 1 04/07/2013 >175 MM  - 08/28/2013 p extensive coaching HFA effectiveness =    75% (ti too short)  DDX of  difficult airways managment all start with A and  include Adherence, Ace Inhibitors, Acid Reflux, Active Sinus Disease, Alpha 1 Antitripsin deficiency, Anxiety masquerading as Airways dz,  ABPA,  allergy(esp in young), Aspiration (esp in elderly), Adverse effects of DPI,  Active smokers, plus two Bs  = Bronchiectasis and Beta blocker use..and one C= CHF  Adherence is always the initial "prime suspect" and is a multilayered concern that requires a "trust but verify" approach in every patient - starting with knowing how to use medications, especially inhalers, correctly, keeping up with refills and understanding the fundamental difference between maintenance and prns vs those medications only taken for a very short course and then stopped and not refilled.   The proper method of use, as well as anticipated side effects, of a metered-dose inhaler are discussed and demonstrated to the patient. Improved effectiveness after extensive coaching during this visit to a level of approximately  75% > continue   Active smoking > discussed separately   ? Active/ acute sinus dz > augmentin x 10 days then sinus ct if not better cough control

## 2013-09-25 ENCOUNTER — Encounter: Payer: Medicaid Other | Admitting: Adult Health

## 2013-09-29 ENCOUNTER — Encounter: Payer: Medicaid Other | Admitting: Adult Health

## 2013-10-26 ENCOUNTER — Encounter: Payer: Medicaid Other | Admitting: Adult Health

## 2013-10-30 ENCOUNTER — Ambulatory Visit (INDEPENDENT_AMBULATORY_CARE_PROVIDER_SITE_OTHER): Payer: Medicaid Other | Admitting: Adult Health

## 2013-10-30 ENCOUNTER — Encounter: Payer: Self-pay | Admitting: Adult Health

## 2013-10-30 VITALS — BP 138/90 | HR 87 | Temp 97.4°F | Ht 58.5 in | Wt 138.4 lb

## 2013-10-30 DIAGNOSIS — J449 Chronic obstructive pulmonary disease, unspecified: Secondary | ICD-10-CM

## 2013-10-30 MED ORDER — ALPRAZOLAM 0.5 MG PO TABS: ORAL_TABLET | ORAL | Status: DC

## 2013-10-30 NOTE — Patient Instructions (Signed)
Great job on not smoking .  Follow med calendar closely and bring to each visit  Follow up Dr. Melvyn Novas  In 4 weeks with PFT .  Please contact office for sooner follow up if symptoms do not improve or worsen or seek emergency care

## 2013-11-04 NOTE — Progress Notes (Signed)
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 May have helped some but did not help breathing or coughing severe to point of going to ER 2/9 no better on prednisone on this course, maybe better with ventolin last used 3 hours prior to OV . rec cycylical cough regimen Augmentin x 10 days If short of breath only use perforomist every 12 hours per neb   09/07/2011 f/u ov/Wert still smoking  Cough and sob Maybe better while on performist and worse when stopped it and no longer coughing up green stuff, only took pred a few days p ov then stopped.   rec Stop smoking completely - it's the most important aspect of your care! Mucinex dm 2 every every hours Symbicort 160 Take 2 puffs first thing in am and then another 2 puffs about 12 hours later.  - ok to use Prednisone 10 mg take  4 each am x 2 days,   2 each am x 2 days,  1 each am x2days and stop if not improving on symbicort Work on inhaler technique:   GERD diet Please schedule a follow up office visit in 2 weeks, sooner if needed with all medications including over the counters in hand.   10/12/2011 f/u ov/Wert cc Patient states cough is better since last visit. Patient also c/o chest tightness. Also states has cut back on smoking to about 3 a day.  No excess sputum or daytime need for saba, no limiting sob rec No change in your medications Completely stop smoking before  smoking completely stops you  Please schedule a follow up office visit in 6 weeks, call sooner if needed with pft's on return   11/29/2011 f/u ov/Wert  Still smoking cc much worse sob since April 20th severe cough >  Green mucus > amoxillicin and prednisone and cough syrup > no improvement at all.  AM of ov woke up at nl hour on day of ov and held all meds until ov for pft's. Does feel some better with albuterol hfa, no longer has neb albuterol.  No overt sinus or hb symptoms >>pred taper    01/24/2012 Follow up and med review  patient returns for a 6 week  followup for COPD She is brought all her medications and her review We reviewed all her medications and organized them into a medication calendar with patient education She has ran out [place] the inhaler Feels that she continues to have shortness of breath with activity  no increased cough or wheezing rec Restart Tudorza 1 puff Twice daily   Brush /rinse and gargle after use  May use Armour/hammer baking soda/peroxide toothpaste  Mycelex troches five times daily for 1 week for thrush Augmentin 875mg  Twice daily  For 7 days  Mucinex DM Twice daily  As needed  Cough/congestion  Follow med calendar closely  and bring to each visi   03/11/2012 Follow up COPD  Returns for 1 month follow up  Got some better but flared again , seen by PCP rx  augmentin and codeine cough syrup-for cough w green mucus last week--feels like she had since improved --spiriva inhaler started by Dr. Melvyn Novas  d/t insurance would not cover tudorza. Has not started spiriva yet.  Feeling better has few days of augmentin left.  No fever or edema  Still DOE and cough on/off .  rec Finish Augmentin as directed.  Continue on Spiriva and Symbicort > insurance required change  spiriva to atrovent  04/23/2012 f/u ov/Wert  Still smoking 2-3 cigs per day on symbicort/atrovent but tudorza has been approved and thinks it worked better.  Doe x one flight rec Please see patient  coordinator before you leave today  to schedule GI eval for bad heartburn Restart Caprice Renshaw one twice a day The key is to stop smoking completely before smoking completely stops you!  GERD diet  See calendar for specific medication instructions and bring it back for each and every office visit d  Late add: needs alpha one AT  [facility] next ov   03/16/2013 f/u ov/Wert stopped all meds March 2014 then downhill since then Chief Complaint  Patient presents with  . Follow-up    Pt states having increased SOB for the past 3 months.  She states has SOB with or without exertion. She has prod cough for the past 2 months-prod with green to yellow sputum.   still smoking but not daily. On acei with sensation of choking/throat irritation /sever cough not much better on aug/pred rx  >d/c ace , PPI /pepcid   04/07/2013 Follow up and med review  She is brought all her medications and her review We reviewed all her medications and organized them into a medication calendar with patient education It appears she is taking meds correctly.   Complains of  prod cough with thick green/yellow mucus, increased SOB, wheezing, chest tightness, hoarseness, some low grade temp x15 days Went to [facility] last week for hoarseness was  Informed throat was ok. But to come back for more detailed exam.  No hemoptysis, chest pain, orthopnea, edema.  Has lost 10 lbs over last year.  Last ov ACE was stopped , has not seen any change in hoarseness or cough.  Still smoking , cessation discussed.  rec Omnicef 300mg  Twice daily  For 7 days  Prednisone taper over next week.  Must quit smoking .  I will call with chest xray  And lab results.  Brush /rinse and gargle after use  May use Armour/hammer baking soda/peroxide toothpaste  Mucinex DM Twice daily  As needed  Cough/congestion  Follow med calendar closely and bring to each visit    04/24/2013 f/u ov/Wert did not bring med cal re: aecopd breathing  Bad since  2010    Chief Complaint  Patient presents with  . COPD    Breathing is getting worse. Saw TP on 03/16/13. Reports SOB, coughing with production of yellow mucus. Finished abx and pred taper with no relief.    Not even better with saba rec Prednisone 10 mg take  4 each am x 2 days,   2 each am x 2 days,  1 each am x 2 days and stop augmentin 875 twice daily x 10 days As per calendar:  For cough mucinex dm 2 every 12 hours and add flutter valve as much as you  can to help cough mucus up effectively     05/08/2013 f/u ov/Wert re: copd/ab still smoking, did not bring calendar but did bring most meds, not mucinex dm Chief Complaint  Patient presents with  . Follow-up    Pt states that her breathing had improved a great deal while on prednisone, and now is getting worse off of the medication. Her cough had also improved, but is now worse and is prod with minimal green sputum. She has been taking proair 2-3 times per day for the past wk.   >>pred taper and return for med calenar  06/29/2013 Follow up and Med Review  Patient returns for a two-month followup and medication review. Patient medications reviewed and organized into a medication calendar with patient education. Appears that she is taking her medications correctly. Says that she has developed  Cough, and congestion. Over the last week. Patient, says she's had family members with similar symptoms. She complains of hoarseness, prod cough with yellow/thick green mucus, wheezing, increased SOB at times x1week.  rec Augmentin 875mg   Twice daily  For 7 days  Prednisone taper over next week.  Must quit smoking .  Brush /rinse and gargle after use  May use Armour/hammer baking soda/peroxide toothpaste  Mucinex DM Twice daily  As needed  Cough/congestion   07/22/2013 acute  ov/Wert re: aecopd, still smoking Chief Complaint  Patient presents with  . Acute Visit    Still having sob, wheezing, chest tightness and cough with green mucus x  3weeks and worsening  contact with another sick member of fm since last flare but never cleared up from the prev ov.  rx already by [facility] with minocyclin and prednisone since 07/18/13  Using proair up to twice daily then abandoned in favor of neb  >>pred burst/maintenence , xanax rx for anxiety  and mso4 for severe cough   08/06/2013 Follow up an med review  Pt returns for follow up and med review  Patient medications reviewed and organized into a medication calendar with patient education. Appears that she is taking her medications correctly. Had COPD flare last ov , tx w/ abx and steroids  Says she was feeling no better. Went back to PCP last week, started on zpack -last dose is tomorrow.  Still has thrush.  Currently on prednisone 20mg  daily  No hemoptysis , chest pain., orthopnea, or fever.  Has a lot of anxiety, stress at home. Feels anxiety makes her worse.  Husband with her today rec Must quit smoking .  Remain on Prednisone 20mg  until back to your baseline then back to 10mg   Xray and labs today.  Diflucan 100mg  2 first day then 1 daily until gone.  Follow med calendar closely and bring to each visit    08/28/2013 f/u ov/Wert re: worse cough x 2 months/ still smoking / using med calendar / on 20 mg prednisone Chief Complaint  Patient presents with  . Follow-up    Pt still SOB, prod cough with green mucous.     coughing greeen since Xmas esp in am, some better p saba hfa but insists on using the neb first. >>augmentin x 10 d   10/30/13 Follow up and Med Review  Patient returns for a followup and medication review. Reviewed all her medications organized them into a medication calendar. Patient education. Patient has not smoked, encouraged on continued smoking cessation. Last visit. Patient had a slow to resolve bronchitis/ sinusitis She was treated with Augmentin for 10 days The symptoms are  much improved She denies any hemoptysis, chest pain, orthopnea, PND, or leg  swelling.    Current Medications, Allergies, Complete Past Medical History, Past Surgical History, Family History, and Social History were reviewed in [employer] record.  ROS  The following are not active complaints unless bolded sore throat, dysphagia, dental problems, itching, sneezing,  nasal congestion or excess/ purulent secretions, ear ache,   fever, chills, sweats, unintended wt loss, pleuritic or exertional cp, hemoptysis,  orthopnea pnd or leg swelling, presyncope, palpitations, heartburn, abdominal pain, anorexia, nausea, vomiting, diarrhea  or change in bowel or urinary habits, change in stools or urine, dysuria,hematuria,  rash, arthralgias, visual complaints, headache, numbness weakness or ataxia or problems with walking or coordination,  change in mood/affect or memory.           Objective:   Physical Exam   08/23/2011  Wt 145 >   149 01/24/2012 >  144 03/11/2012 > 04/23/2012  142 > 03/16/2013  130 >04/07/2013 > 132 04/24/13 > 134 05/08/2013 >134 06/29/2013 > 07/22/2013 130 > 08/28/2013 135 >138 10/30/13     NAD    HEENT mild turbinate edema.  Oropharynx clear   NECK:  Supple w/ fair ROM; no JVD; normal carotid impulses w/o bruits; no thyromegaly or nodules palpated; no lymphadenopathy.  RESP  No wheezing,  no accessory muscle use, no dullness to percussion  CARD:  RRR, no m/r/g  , no peripheral edema, pulses intact, no cyanosis or clubbing.  GI:   Soft & nt; nml bowel sounds; no organomegaly or masses detected.  Musco: Warm bil, no deformities or joint swelling noted.   Neuro: alert, no focal deficits noted.    Skin: Warm, no lesions or rashes   08/06/13 CXR No active cardiopulmonary disease.       Assessment & Plan:

## 2013-11-04 NOTE — Assessment & Plan Note (Signed)
Compensated on present regimen  Patient's medications were reviewed today and patient education was given. Computerized medication calendar was adjusted/completed  Needs PFT   Plan  Great job on not smoking .  Follow med calendar closely and bring to each visit  Follow up Dr. Melvyn Novas  In 4 weeks with PFT .  Please contact office for sooner follow up if symptoms do not improve or worsen or seek emergency care

## 2013-11-26 ENCOUNTER — Other Ambulatory Visit: Payer: Self-pay | Admitting: Adult Health

## 2013-11-27 ENCOUNTER — Telehealth: Payer: Self-pay | Admitting: Internal Medicine

## 2013-11-27 MED ORDER — ALPRAZOLAM 0.5 MG PO TABS: ORAL_TABLET | ORAL | Status: DC

## 2013-11-27 NOTE — Telephone Encounter (Signed)
[facility] for refill but should be doing f/u and rx of this through primary care

## 2013-11-27 NOTE — Telephone Encounter (Signed)
Pt given rx for alprazolam 0.5 mg # 40 on 10/30/13 by TP  Please advise if okay to refill, thanks!

## 2013-11-27 NOTE — Telephone Encounter (Signed)
Rx was called in  [facility]

## 2013-12-01 NOTE — Telephone Encounter (Signed)
[id number]. Larena Ohnemus, CMA  

## 2013-12-02 NOTE — Telephone Encounter (Signed)
I called pharmacy and pt picked up rx. Bing, CMA

## 2013-12-14 ENCOUNTER — Ambulatory Visit (INDEPENDENT_AMBULATORY_CARE_PROVIDER_SITE_OTHER)
Admission: RE | Admit: 2013-12-14 | Discharge: 2013-12-14 | Disposition: A | Discharge status: Home / Self Care | Payer: Medicaid Other | Source: Ambulatory Visit | Attending: Adult Health | Admitting: Adult Health

## 2013-12-14 ENCOUNTER — Encounter: Payer: Self-pay | Admitting: Adult Health

## 2013-12-14 ENCOUNTER — Ambulatory Visit (INDEPENDENT_AMBULATORY_CARE_PROVIDER_SITE_OTHER): Payer: Medicaid Other | Admitting: Adult Health

## 2013-12-14 VITALS — BP 108/66 | HR 100 | Temp 98.3°F | Ht 58.5 in | Wt 136.0 lb

## 2013-12-14 DIAGNOSIS — J449 Chronic obstructive pulmonary disease, unspecified: Secondary | ICD-10-CM

## 2013-12-14 MED ORDER — METHYLPREDNISOLONE ACETATE 80 MG/ML IJ SUSP
120.0000 mg | Freq: Once | INTRAMUSCULAR | Status: AC
Start: 2013-12-14 — End: 2013-12-14
  Administered 2013-12-14: 120 mg via INTRAMUSCULAR

## 2013-12-14 MED ORDER — LEVALBUTEROL HCL 0.63 MG/3ML IN NEBU
0.6300 mg | INHALATION_SOLUTION | Freq: Once | RESPIRATORY_TRACT | Status: AC
  Administered 2013-12-14: 0.63 mg via RESPIRATORY_TRACT

## 2013-12-14 MED ORDER — PREDNISONE 10 MG PO TABS: ORAL_TABLET | ORAL | Status: DC

## 2013-12-14 NOTE — Patient Instructions (Signed)
Chest xray today  Prednisone taper over next week.  Mucinex DM Twice daily  As needed  Cough/congestion  Follow up Dr. Melvyn Novas  2 weeks with PFT .

## 2013-12-14 NOTE — Assessment & Plan Note (Signed)
Slow to resolve exacerbation  xopenex neb x 1  Hold on additional abx for now - check cxr  Depo medrol 120mg  IM x 1   Plan  Chest xray today  Prednisone taper over next week.  Mucinex DM Twice daily  As needed  Cough/congestion  Follow up Dr. Melvyn Novas  2 weeks with PFT .

## 2013-12-14 NOTE — Progress Notes (Signed)
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 May have helped some but did not help breathing or coughing severe to point of going to ER 2/9 no better on prednisone on this course, maybe better with ventolin last used 3 hours prior to OV . rec cycylical cough regimen Augmentin x 10 days If short of breath only use perforomist every 12 hours per neb   09/07/2011 f/u ov/Wert still smoking  Cough and sob Maybe better while on performist and worse when stopped it and no longer coughing up green stuff, only took pred a few days p ov then stopped.   rec Stop smoking completely - it's the most important aspect of your care! Mucinex dm 2 every every hours Symbicort 160 Take 2 puffs first thing in am and then another 2 puffs about 12 hours later.  - ok to use Prednisone 10 mg take  4 each am x 2 days,   2 each am x 2 days,  1 each am x2days and stop if not improving on symbicort Work on inhaler technique:   GERD diet Please schedule a follow up office visit in 2 weeks, sooner if needed with all medications including over the counters in hand.   10/12/2011 f/u ov/Wert cc Patient states cough is better since last visit. Patient also c/o chest tightness. Also states has cut back on smoking to about 3 a day.  No excess sputum or daytime need for saba, no limiting sob rec No change in your medications Completely stop smoking before  smoking completely stops you  Please schedule a follow up office visit in 6 weeks, call sooner if needed with pft's on return   11/29/2011 f/u ov/Wert  Still smoking cc much worse sob since April 20th severe cough >  Green mucus > amoxillicin and prednisone and cough syrup > no improvement at all.  AM of ov woke up at nl hour on day of ov and held all meds until ov for pft's. Does feel some better with albuterol hfa, no longer has neb albuterol.  No overt sinus or hb symptoms >>pred taper    01/24/2012 Follow up and med review  patient returns for a 6 week  followup for COPD She is brought all her medications and her review We reviewed all her medications and organized them into a medication calendar with patient education She has ran out [place] the inhaler Feels that she continues to have shortness of breath with activity  no increased cough or wheezing rec Restart Tudorza 1 puff Twice daily   Brush /rinse and gargle after use  May use Armour/hammer baking soda/peroxide toothpaste  Mycelex troches five times daily for 1 week for thrush Augmentin 875mg  Twice daily  For 7 days  Mucinex DM Twice daily  As needed  Cough/congestion  Follow med calendar closely  and bring to each visi   03/11/2012 Follow up COPD  Returns for 1 month follow up  Got some better but flared again , seen by PCP rx  augmentin and codeine cough syrup-for cough w green mucus last week--feels like she had since improved --spiriva inhaler started by Dr. Melvyn Novas  d/t insurance would not cover tudorza. Has not started spiriva yet.  Feeling better has few days of augmentin left.  No fever or edema  Still DOE and cough on/off .  rec Finish Augmentin as directed.  Continue on Spiriva and Symbicort > insurance required change  spiriva to atrovent  04/23/2012 f/u ov/Wert  Still smoking 2-3 cigs per day on symbicort/atrovent but tudorza has been approved and thinks it worked better.  Doe x one flight rec Please see patient  coordinator before you leave today  to schedule GI eval for bad heartburn Restart Caprice Renshaw one twice a day The key is to stop smoking completely before smoking completely stops you!  GERD diet  See calendar for specific medication instructions and bring it back for each and every office visit d  Late add: needs alpha one AT  [facility] next ov   03/16/2013 f/u ov/Wert stopped all meds March 2014 then downhill since then Chief Complaint  Patient presents with  . Follow-up    Pt states having increased SOB for the past 3 months.  She states has SOB with or without exertion. She has prod cough for the past 2 months-prod with green to yellow sputum.   still smoking but not daily. On acei with sensation of choking/throat irritation /sever cough not much better on aug/pred rx  >d/c ace , PPI /pepcid   04/07/2013 Follow up and med review  She is brought all her medications and her review We reviewed all her medications and organized them into a medication calendar with patient education It appears she is taking meds correctly.   Complains of  prod cough with thick green/yellow mucus, increased SOB, wheezing, chest tightness, hoarseness, some low grade temp x15 days Went to [facility] last week for hoarseness was  Informed throat was ok. But to come back for more detailed exam.  No hemoptysis, chest pain, orthopnea, edema.  Has lost 10 lbs over last year.  Last ov ACE was stopped , has not seen any change in hoarseness or cough.  Still smoking , cessation discussed.  rec Omnicef 300mg  Twice daily  For 7 days  Prednisone taper over next week.  Must quit smoking .  I will call with chest xray  And lab results.  Brush /rinse and gargle after use  May use Armour/hammer baking soda/peroxide toothpaste  Mucinex DM Twice daily  As needed  Cough/congestion  Follow med calendar closely and bring to each visit    04/24/2013 f/u ov/Wert did not bring med cal re: aecopd breathing  Bad since  2010    Chief Complaint  Patient presents with  . COPD    Breathing is getting worse. Saw TP on 03/16/13. Reports SOB, coughing with production of yellow mucus. Finished abx and pred taper with no relief.    Not even better with saba rec Prednisone 10 mg take  4 each am x 2 days,   2 each am x 2 days,  1 each am x 2 days and stop augmentin 875 twice daily x 10 days As per calendar:  For cough mucinex dm 2 every 12 hours and add flutter valve as much as you  can to help cough mucus up effectively     05/08/2013 f/u ov/Wert re: copd/ab still smoking, did not bring calendar but did bring most meds, not mucinex dm Chief Complaint  Patient presents with  . Follow-up    Pt states that her breathing had improved a great deal while on prednisone, and now is getting worse off of the medication. Her cough had also improved, but is now worse and is prod with minimal green sputum. She has been taking proair 2-3 times per day for the past wk.   >>pred taper and return for med calenar  06/29/2013 Follow up and Med Review  Patient returns for a two-month followup and medication review. Patient medications reviewed and organized into a medication calendar with patient education. Appears that she is taking her medications correctly. Says that she has developed  Cough, and congestion. Over the last week. Patient, says she's had family members with similar symptoms. She complains of hoarseness, prod cough with yellow/thick green mucus, wheezing, increased SOB at times x1week.  rec Augmentin 875mg   Twice daily  For 7 days  Prednisone taper over next week.  Must quit smoking .  Brush /rinse and gargle after use  May use Armour/hammer baking soda/peroxide toothpaste  Mucinex DM Twice daily  As needed  Cough/congestion   07/22/2013 acute  ov/Wert re: aecopd, still smoking Chief Complaint  Patient presents with  . Acute Visit    Still having sob, wheezing, chest tightness and cough with green mucus x  3weeks and worsening  contact with another sick member of fm since last flare but never cleared up from the prev ov.  rx already by [facility] with minocyclin and prednisone since 07/18/13  Using proair up to twice daily then abandoned in favor of neb  >>pred burst/maintenence , xanax rx for anxiety  and mso4 for severe cough   08/06/2013 Follow up an med review  Pt returns for follow up and med review  Patient medications reviewed and organized into a medication calendar with patient education. Appears that she is taking her medications correctly. Had COPD flare last ov , tx w/ abx and steroids  Says she was feeling no better. Went back to PCP last week, started on zpack -last dose is tomorrow.  Still has thrush.  Currently on prednisone 20mg  daily  No hemoptysis , chest pain., orthopnea, or fever.  Has a lot of anxiety, stress at home. Feels anxiety makes her worse.  Husband with her today rec Must quit smoking .  Remain on Prednisone 20mg  until back to your baseline then back to 10mg   Xray and labs today.  Diflucan 100mg  2 first day then 1 daily until gone.  Follow med calendar closely and bring to each visit    08/28/2013 f/u ov/Wert re: worse cough x 2 months/ still smoking / using med calendar / on 20 mg prednisone Chief Complaint  Patient presents with  . Follow-up    Pt still SOB, prod cough with green mucous.     coughing greeen since Xmas esp in am, some better p saba hfa but insists on using the neb first. >>augmentin x 10 d   10/30/13 Follow up and Med Review  Patient returns for a followup and medication review. Reviewed all her medications organized them into a medication calendar. Patient education. Patient has not smoked, encouraged on continued smoking cessation. Last visit. Patient had a slow to resolve bronchitis/ sinusitis She was treated with Augmentin for 10 days The symptoms are  much improved She denies any hemoptysis, chest pain, orthopnea, PND, or leg  swelling.  >>med calendar   12/14/2013 Acute OV  Pt complains of increased SOB, wheezing, hoarseness, prod cough with green/yellow mucus for 6 weeks.  Waxing and waning  With congestion.  No fever for last few weeks.  Saw  PCP at onset and has finished 2 rounds augmentin.  fever at onset.  Last abx finished 3 days ago.  Wheezing is worse this last week.  No hemoptysis, chest pain , abd pain, n/v/d.  Husband getting surgery this week.  Mucinex DM is not helping.  Still smoking     Family has been sick.  Current Medications, Allergies, Complete Past Medical History, Past Surgical History, Family History, and Social History were reviewed in [employer] record.  ROS  The following are not active complaints unless bolded sore throat, dysphagia, dental problems, itching, sneezing,  ++nasal congestion or excess/ purulent secretions, ear ache,   fever, chills, sweats, NO unintended wt loss, pleuritic or exertional cp, hemoptysis,  orthopnea pnd or leg swelling, presyncope, palpitations, heartburn, abdominal pain, anorexia, nausea, vomiting, diarrhea  or change in bowel or urinary habits, change in stools or urine, dysuria,hematuria,  rash, arthralgias, visual complaints, headache, numbness weakness or ataxia or problems with walking or coordination,  change in mood/affect or memory.           Objective:   Physical Exam   08/23/2011  Wt 145 >   149 01/24/2012 >  144 03/11/2012 > 04/23/2012  142 > 03/16/2013  130 >04/07/2013 > 132 04/24/13 > 134 05/08/2013 >134 06/29/2013 > 07/22/2013 130 > 08/28/2013 135 >138 10/30/13 >136 12/14/2013     NAD hoarse    HEENT mild turbinate edema.  Oropharynx clear   NECK:  Supple w/ fair ROM; no JVD; normal carotid impulses w/o bruits; no thyromegaly or nodules palpated; no lymphadenopathy.  RESP  Few exp wheezing,  no accessory muscle use, no dullness to percussion  CARD:  RRR, no m/r/g  , no peripheral edema, pulses intact, no cyanosis or  clubbing.  GI:   Soft & nt; nml bowel sounds; no organomegaly or masses detected.  Musco: Warm bil, no deformities or joint swelling noted.   Neuro: alert, no focal deficits noted.    Skin: Warm, no lesions or rashes   08/06/13 CXR No active cardiopulmonary disease.       Assessment & Plan:

## 2013-12-14 NOTE — Addendum Note (Signed)
Addended by: Parke Poisson E on: 12/14/2013 10:58 AM   Modules accepted: Orders

## 2013-12-16 ENCOUNTER — Telehealth: Payer: Self-pay | Admitting: Adult Health

## 2013-12-16 NOTE — Telephone Encounter (Signed)
Attempted to call x1 LMTCB 

## 2013-12-16 NOTE — Progress Notes (Signed)
Quick Note:  LMOM TCB x1. ______ 

## 2013-12-17 NOTE — Telephone Encounter (Signed)
  lmomtcb x 2  Notes Recorded by Melvenia Needles, NP on 12/14/2013 at 2:48 PM No sign of PNA  Cont w/ ov recs

## 2013-12-17 NOTE — Telephone Encounter (Signed)
Pt advised of results. Pt asked that a message be sent to [name] because she has a few questions. I advised the pt that I was the triage nurse and I will be happy to take care of her questions. Pt continued to insists on message being sent directly to [place] asking her to call. Pt asks that she be called on #[phone]. [place] Bing, CMA

## 2013-12-18 NOTE — Telephone Encounter (Signed)
Pt is asking to be reached at [phone].  Pt states that she just feels more comfortable with [name]-please call when you get a free moment.  [name]

## 2013-12-18 NOTE — Progress Notes (Signed)
Quick Note:  Pt aware per 6.10.15 phone note ______

## 2013-12-18 NOTE — Telephone Encounter (Signed)
Pt wanted to speak with [place].  Will forward this message to her.

## 2013-12-18 NOTE — Telephone Encounter (Signed)
Per OV 12/14/13; Patient Instructions      Chest xray today   Prednisone taper over next week.   Mucinex DM Twice daily  As needed  Cough/congestion   Follow up Dr. Melvyn Novas  2 weeks with PFT  --  Pt reports she is still feeling SOB, prod cough-yellow-green thick phlem, chest cong, raspy voice, slight wheezing. Denies any nasal cong, no PND. She feels like she is better than how she was feeling. She will finish the pred taper on Monday. She is also taking mucinex 2 po q12hrs. Requesting further recs since it is the weekend. Please advise TP thanks  Allergies  Allergen Reactions  . Cymbalta [Duloxetine Hcl] Nausea And Vomiting  . Pregabalin Swelling  . Wellbutrin [Bupropion]     Pt stated that this was back from the 1990's.  This medication made her very nervous     Current Outpatient Prescriptions on File Prior to Visit  Medication Sig Dispense Refill  . Aclidinium Bromide (TUDORZA PRESSAIR) 400 MCG/ACT AEPB Inhale 1 puff into the lungs 2 (two) times daily. (PLAN A)  1 each  11  . albuterol (PROAIR HFA) 108 (90 BASE) MCG/ACT inhaler Inhale 2 puffs into the lungs every 4 (four) hours as needed for wheezing or shortness of breath ((PLAN B)).       Marland Kitchen albuterol (PROVENTIL) (2.5 MG/3ML) 0.083% nebulizer solution Take 3 mLs (2.5 mg total) by nebulization every 4 (four) hours as needed. DX 496 (PLAN C)  360 mL  5  . ALPRAZolam (XANAX) 0.5 MG tablet One every 6 hours if anxious related to breathing  40 tablet  0  . amitriptyline (ELAVIL) 50 MG tablet Take 50 mg by mouth at bedtime.      . budesonide-formoterol (SYMBICORT) 160-4.5 MCG/ACT inhaler Inhale 2 puffs into the lungs 2 (two) times daily. Take 2 puffs first thing in am and then another 2 puffs about 12 hours later (PLAN A)  1 Inhaler  11  . dexlansoprazole (DEXILANT) 60 MG capsule Take 30-60 min before first meal of the day  30 capsule    . dextromethorphan-guaiFENesin (MUCINEX DM) 30-600 MG per 12 hr tablet Take 1-2 tablets by mouth every 12  (twelve) hours as needed (w/ flutter).       . famotidine (PEPCID) 20 MG tablet One at bedtime  30 tablet  2  . fentaNYL (DURAGESIC - DOSED MCG/HR) 100 MCG/HR Place 1 patch onto the skin every other day.      . hydrochlorothiazide (HYDRODIURIL) 25 MG tablet Take 25 mg by mouth daily.      . Misc. Devices (ACAPELLA) MISC Use as directed  1 each  0  . morphine (MSIR) 30 MG tablet Take 30 mg by mouth 4 (four) times daily. May add 1 every 4 hours as needed for severe pain      . predniSONE (DELTASONE) 10 MG tablet 4 tabs for 2 days, then 3 tabs for 2 days, 2 tabs for 2 days, then 1 tab for 2 days, then stop  20 tablet  0  . valsartan (DIOVAN) 160 MG tablet Take 1 tablet (160 mg total) by mouth daily.  30 tablet  11   No current facility-administered medications on file prior to visit.

## 2013-12-18 NOTE — Telephone Encounter (Signed)
Per TP: Cont w/ OV recs. OV w/ MW if not better. Seek emergency care if worsens over the weekend.  Called pt made aware of recs. She voiced her understanding. Nothing further needed

## 2013-12-18 NOTE — Telephone Encounter (Signed)
Pt is calling back again but she is unable to get to [name] pt is pretty sick and she was put on predisone and is wanting to know if she can have another round of predisone and or can she have a antibodic  [facility]    Her phone # is [phone]

## 2013-12-24 ENCOUNTER — Telehealth: Payer: Self-pay | Admitting: Adult Health

## 2013-12-24 MED ORDER — ALPRAZOLAM 0.5 MG PO TABS: ORAL_TABLET | ORAL | Status: DC

## 2013-12-24 NOTE — Telephone Encounter (Signed)
Ok x one, be sure to bring all meds and calendar to [facility]

## 2013-12-24 NOTE — Telephone Encounter (Signed)
Spoke with pt and advised that Alprazolam was refilled x 1 per Dr Melvyn Novas,  Pt has appt with MW tomorrow and was advised to bring all meds and calendar with her.  Pt verbalized understanding.

## 2013-12-24 NOTE — Telephone Encounter (Signed)
Pt saw TP on 12/14/13 Last refill for alprazolam 11/27/13 #40 x 0 refills Please advise MW thanks

## 2013-12-24 NOTE — Telephone Encounter (Signed)
lmtcb for pt.  

## 2013-12-25 ENCOUNTER — Ambulatory Visit (INDEPENDENT_AMBULATORY_CARE_PROVIDER_SITE_OTHER): Payer: Medicaid Other | Admitting: Internal Medicine

## 2013-12-25 ENCOUNTER — Encounter: Payer: Self-pay | Admitting: Internal Medicine

## 2013-12-25 VITALS — BP 102/60 | HR 99 | Temp 98.3°F | Ht 58.5 in | Wt 132.6 lb

## 2013-12-25 DIAGNOSIS — J449 Chronic obstructive pulmonary disease, unspecified: Secondary | ICD-10-CM

## 2013-12-25 MED ORDER — PREDNISONE 10 MG PO TABS: ORAL_TABLET | ORAL | Status: DC

## 2013-12-25 NOTE — Patient Instructions (Addendum)
Whenever needing nebulizer as a backup more than rarely > prednisone 10 mg x 2 each am until better then 1 daily  X 3 days and stop  Please schedule a follow up office visit in 6 weeks, call sooner if needed to see Tammy with all medications

## 2013-12-25 NOTE — Progress Notes (Signed)
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 May have helped some but did not help breathing or coughing severe to point of going to ER 2/9 no better on prednisone on this course, maybe better with ventolin last used 3 hours prior to OV . rec cycylical cough regimen Augmentin x 10 days If short of breath only use perforomist every 12 hours per neb   09/07/2011 f/u ov/Wert still smoking  Cough and sob Maybe better while on performist and worse when stopped it and no longer coughing up green stuff, only took pred a few days p ov then stopped.   rec Stop smoking completely - it's the most important aspect of your care! Mucinex dm 2 every every hours Symbicort 160 Take 2 puffs first thing in am and then another 2 puffs about 12 hours later.  - ok to use Prednisone 10 mg take  4 each am x 2 days,   2 each am x 2 days,  1 each am x2days and stop if not improving on symbicort Work on inhaler technique:   GERD diet Please schedule a follow up office visit in 2 weeks, sooner if needed with all medications including over the counters in hand.   10/12/2011 f/u ov/Wert cc Patient states cough is better since last visit. Patient also c/o chest tightness. Also states has cut back on smoking to about 3 a day.  No excess sputum or daytime need for saba, no limiting sob rec No change in your medications Completely stop smoking before  smoking completely stops you  Please schedule a follow up office visit in 6 weeks, call sooner if needed with pft's on return   11/29/2011 f/u ov/Wert  Still smoking cc much worse sob since April 20th severe cough >  Green mucus > amoxillicin and prednisone and cough syrup > no improvement at all.  AM of ov woke up at nl hour on day of ov and held all meds until ov for pft's. Does feel some better with albuterol hfa, no longer has neb albuterol.  No overt sinus or hb symptoms >>pred taper    01/24/2012 Follow up and med review  patient returns for a 6 week  followup for COPD She is brought all her medications and her review We reviewed all her medications and organized them into a medication calendar with patient education She has ran out [place] the inhaler Feels that she continues to have shortness of breath with activity  no increased cough or wheezing rec Restart Tudorza 1 puff Twice daily   Brush /rinse and gargle after use  May use Armour/hammer baking soda/peroxide toothpaste  Mycelex troches five times daily for 1 week for thrush Augmentin 875mg  Twice daily  For 7 days  Mucinex DM Twice daily  As needed  Cough/congestion  Follow med calendar closely  and bring to each visi   03/11/2012 Follow up COPD  Returns for 1 month follow up  Got some better but flared again , seen by PCP rx  augmentin and codeine cough syrup-for cough w green mucus last week--feels like she had since improved --spiriva inhaler started by Dr. Melvyn Novas  d/t insurance would not cover tudorza. Has not started spiriva yet.  Feeling better has few days of augmentin left.  No fever or edema  Still DOE and cough on/off .  rec Finish Augmentin as directed.  Continue on Spiriva and Symbicort > insurance required change  spiriva to atrovent  04/23/2012 f/u ov/Wert  Still smoking 2-3 cigs per day on symbicort/atrovent but tudorza has been approved and thinks it worked better.  Doe x one flight rec Please see patient  coordinator before you leave today  to schedule GI eval for bad heartburn Restart Caprice Renshaw one twice a day The key is to stop smoking completely before smoking completely stops you!  GERD diet  See calendar for specific medication instructions and bring it back for each and every office visit d  Late add: needs alpha one AT  [facility] next ov   03/16/2013 f/u ov/Wert stopped all meds March 2014 then downhill since then Chief Complaint  Patient presents with  . Follow-up    Pt states having increased SOB for the past 3 months.  She states has SOB with or without exertion. She has prod cough for the past 2 months-prod with green to yellow sputum.   still smoking but not daily. On acei with sensation of choking/throat irritation /sever cough not much better on aug/pred rx  >d/c ace , PPI /pepcid   04/07/2013 Follow up and med review  She is brought all her medications and her review We reviewed all her medications and organized them into a medication calendar with patient education It appears she is taking meds correctly.   Complains of  prod cough with thick green/yellow mucus, increased SOB, wheezing, chest tightness, hoarseness, some low grade temp x15 days Went to ENT  screening last week for hoarseness was  Informed throat was ok. But to come back for more detailed exam.  No hemoptysis, chest pain, orthopnea, edema.  Has lost 10 lbs over last year.  Last ov ACE was stopped , has not seen any change in hoarseness or cough.  Still smoking , cessation discussed.  rec Omnicef 300mg  Twice daily  For 7 days  Prednisone taper over next week.  Must quit smoking .  I will call with chest xray  And lab results.  Brush /rinse and gargle after use  May use Armour/hammer baking soda/peroxide toothpaste  Mucinex DM Twice daily  As needed  Cough/congestion  Follow med calendar closely and bring to each visit    04/24/2013 f/u ov/Wert did not bring med cal re: aecopd breathing  Bad since  2010    Chief Complaint  Patient presents with  . COPD    Breathing is getting worse. Saw TP on 03/16/13. Reports SOB, coughing with production of yellow mucus. Finished abx and pred taper with no relief.    Not even better with saba rec Prednisone 10 mg take  4 each am x 2 days,   2 each am x 2 days,  1 each am x 2 days and stop augmentin 875 twice daily x 10 days As per calendar:  For cough mucinex dm 2 every 12 hours and add flutter valve as much as you  can to help cough mucus up effectively     05/08/2013 f/u ov/Wert re: copd/ab still smoking, did not bring calendar but did bring most meds, not mucinex dm Chief Complaint  Patient presents with  . Follow-up    Pt states that her breathing had improved a great deal while on prednisone, and now is getting worse off of the medication. Her cough had also improved, but is now worse and is prod with minimal green sputum. She has been taking proair 2-3 times per day for the past wk.   >>pred taper and return for med calenar  06/29/2013 Follow up and Med Review  Patient returns for a two-month followup and medication review. Patient medications reviewed and organized into a medication calendar with patient education. Appears that she is taking her medications correctly. Says that she has developed  Cough, and congestion. Over the last week. Patient, says she's had family members with similar symptoms. She complains of hoarseness, prod cough with yellow/thick green mucus, wheezing, increased SOB at times x1week.  rec Augmentin 875mg   Twice daily  For 7 days  Prednisone taper over next week.  Must quit smoking .  Brush /rinse and gargle after use  May use Armour/hammer baking soda/peroxide toothpaste  Mucinex DM Twice daily  As needed  Cough/congestion   07/22/2013 acute  ov/Wert re: aecopd, still smoking Chief Complaint  Patient presents with  . Acute Visit    Still having sob, wheezing, chest tightness and cough with green mucus x  3weeks and worsening  contact with another sick member of fm since last flare but never cleared up from the prev ov.  rx already by [facility] with minocyclin and prednisone since 07/18/13  Using proair up to twice daily then abandoned in favor of neb  >>pred burst/maintenence , xanax rx for anxiety  and mso4 for severe cough   08/06/2013 Follow up an med review  Pt returns for follow up and med review  Patient medications reviewed and organized into a medication calendar with patient education. Appears that she is taking her medications correctly. Had COPD flare last ov , tx w/ abx and steroids  Says she was feeling no better. Went back to PCP last week, started on zpack -last dose is tomorrow.  Still has thrush.  Currently on prednisone 20mg  daily  No hemoptysis , chest pain., orthopnea, or fever.  Has a lot of anxiety, stress at home. Feels anxiety makes her worse.  Husband with her today rec Must quit smoking .  Remain on Prednisone 20mg  until back to your baseline then back to 10mg   Xray and labs today.  Diflucan 100mg  2 first day then 1 daily until gone.  Follow med calendar closely and bring to each visit    08/28/2013 f/u ov/Wert re: worse cough x 2 months/ still smoking / using med calendar / on 20 mg prednisone Chief Complaint  Patient presents with  . Follow-up    Pt still SOB, prod cough with green mucous.     coughing greeen since Xmas esp in am, some better p saba hfa but insists on using the neb first. >>augmentin x 10 d   10/30/13 Follow up and Med Review  Patient returns for a followup and medication review. Reviewed all her medications organized them into a medication calendar. Patient education. Patient has not smoked, encouraged on continued smoking cessation. Last visit. Patient had a slow to resolve bronchitis/ sinusitis She was treated with Augmentin for 10 days The symptoms are  much improved She denies any hemoptysis, chest pain, orthopnea, PND, or leg  swelling.  >>med calendar    12/14/2013 Acute OV sick since first week in May  Pt complains of increased SOB, wheezing, hoarseness, prod cough with green/yellow mucus for 6 weeks.  Waxing and waning  With congestion.  No fever for last few weeks.  Saw  PCP at onset and has finished 2 rounds augmentin.  fever at onset.  Last abx finished 3 days ago.  Wheezing is worse this last week.  No hemoptysis, chest pain , abd pain, n/v/d.  Husband getting surgery this week.  Mucinex DM is not helping.  Still smoking  rec Chest xray today  Prednisone taper over next week.  Mucinex DM Twice daily  As needed  Cough/congestion   12/25/2013 f/u ov/Wert re: exac not back to baseline since first week in May / denies smoking in 08/2013  Chief Complaint  Patient presents with  . ACUTE OFFICE VISIT    Pt c/o increased cough with thick green mucus. No improvement since OV with TP. Pt has completed 2 rounds of abx and 1 pred taper.  sob now walking on flat, can't do grocer shopping  Very congested cough  Sleeping in recliner was back in bed until acute illess  Using plan B and C together.   No obvious day to day or daytime variabilty or assoc  cp or chest tightness, subjective wheeze overt sinus or hb symptoms. No unusual exp hx or h/o childhood pna/ asthma or knowledge of premature birth.  Sleeping ok without nocturnal  or early am exacerbation  of respiratory  c/o's or need for noct saba. Also denies any obvious fluctuation of symptoms with weather or environmental changes or other aggravating or alleviating factors except as outlined above   Current Medications, Allergies, Complete Past Medical History, Past Surgical History, Family History, and Social History were reviewed in [employer] record.  ROS  The following are not active complaints unless bolded sore throat, dysphagia, dental problems, itching, sneezing,  nasal congestion or excess/ purulent secretions, ear ache,    fever, chills, sweats, unintended wt loss, pleuritic or exertional cp, hemoptysis,  orthopnea pnd or leg swelling, presyncope, palpitations, heartburn, abdominal pain, anorexia, nausea, vomiting, diarrhea  or change in bowel or urinary habits, change in stools or urine, dysuria,hematuria,  rash, arthralgias, visual complaints, headache, numbness weakness or ataxia or problems with walking or coordination,  change in mood/affect or memory.                Objective:   Physical Exam   08/23/2011  Wt 145 >   149 01/24/2012 >  144 03/11/2012 > 04/23/2012  142 > 03/16/2013  130 >04/07/2013 > 132 04/24/13 > 134 05/08/2013 >134 06/29/2013 > 07/22/2013 130 > 08/28/2013 135 >138 10/30/13 >136 12/14/2013 >  12/25/2013 133     NAD hoarse    HEENT mild turbinate edema.  Oropharynx clear   NECK:  Supple w/ fair ROM; no JVD; normal carotid impulses w/o bruits; no thyromegaly or nodules palpated; no lymphadenopathy.  RESP  Few bilateral exp wheezes,  no accessory muscle use, no dullness to percussion  CARD:  RRR, no m/r/g  , no peripheral edema, pulses intact, no cyanosis or clubbing.  GI:   Soft & nt; nml bowel sounds; no organomegaly or masses detected.  Musco: Warm bil, no deformities or joint swelling noted.   Neuro: alert, no focal deficits noted.    Skin: Warm, no  lesions or rashes   cxr 12/14/13 Stable chest. No acute cardiopulmonary process.        Assessment & Plan:

## 2013-12-28 NOTE — Assessment & Plan Note (Addendum)
-   PFT's 11/29/2011 FEV1  1.08 (46%) ratio 43 and 15 % better p B2   - HFA 75% 11/29/2011 > added Tudorza>changed to spiriva /insurance > changed back to tudorza 04/23/2012    -alpha 1 04/07/2013 >175 MM  -med calendar 10/30/13     - 12/25/2013 p extensive coaching HFA effectiveness =    75% (ti too short)  No choice but at this point to start low dose prednisone. The goal with a chronic steroid dependent illness is always arriving at the lowest effective dose that controls the disease/symptoms and not accepting a set "formula" which is based on statistics or guidelines that don't always take into account patient  variability or the natural hx of the dz in every individual patient, which may well vary over time.  For now therefore I recommend the patient maintain  Ceiling of 20 and a floor of zero using neb need as indicator needs to be on the 20 mg dose   The proper method of use, as well as anticipated side effects, of a metered-dose inhaler are discussed and demonstrated to the patient. Improved effectiveness after extensive coaching during this visit to a level of approximately  75%     Each maintenance medication was reviewed in detail including most importantly the difference between maintenance and as needed and under what circumstances the prns are to be used. This was done in the context of a medication calendar review which provided the patient with a user-friendly unambiguous mechanism for medication administration and reconciliation and provides an action plan for all active problems. It is critical that this be shown to every doctor  for modification during the office visit if necessary so the patient can use it as a working document.

## 2014-01-14 ENCOUNTER — Telehealth: Payer: Self-pay | Admitting: Internal Medicine

## 2014-01-14 MED ORDER — ALPRAZOLAM 0.5 MG PO TABS: ORAL_TABLET | ORAL | Status: DC

## 2014-01-14 NOTE — Telephone Encounter (Signed)
Called spoke with pt. Aware RX has been called into [facility]. Nothing further needed

## 2014-01-14 NOTE — Telephone Encounter (Signed)
ok 

## 2014-01-14 NOTE — Telephone Encounter (Signed)
Xanax last refilled 12/24/13 # 40 x 0 refills One every 6 hours if anxious related to breathing Last OV 12/25/13 Pending 02/05/14 Please advise MW thanks

## 2014-01-20 ENCOUNTER — Ambulatory Visit: Payer: Medicaid Other | Admitting: Internal Medicine

## 2014-02-05 ENCOUNTER — Encounter: Payer: Self-pay | Admitting: Adult Health

## 2014-02-05 ENCOUNTER — Ambulatory Visit (INDEPENDENT_AMBULATORY_CARE_PROVIDER_SITE_OTHER): Payer: Medicaid Other | Admitting: Adult Health

## 2014-02-05 VITALS — BP 108/76 | HR 106 | Temp 98.6°F | Ht 58.5 in | Wt 130.8 lb

## 2014-02-05 DIAGNOSIS — J449 Chronic obstructive pulmonary disease, unspecified: Secondary | ICD-10-CM

## 2014-02-05 MED ORDER — LEVOFLOXACIN 500 MG PO TABS: 500.0000 mg | ORAL_TABLET | Freq: Every day | ORAL | Status: AC

## 2014-02-05 MED ORDER — PREDNISONE 10 MG PO TABS: ORAL_TABLET | ORAL | Status: DC

## 2014-02-05 MED ORDER — ALPRAZOLAM 0.5 MG PO TABS: ORAL_TABLET | ORAL | Status: DC

## 2014-02-05 NOTE — Patient Instructions (Addendum)
Levaquin 500mg  daily for 10 days -take with food Eat yogurt , take probiotic while on antibiotics Prednisone taper today -use my taper -if not improved go back to med calendar prednisone instructions.  Use Zyrtec 10mg  At bedtime  For 5 days then As needed  Drainage.  Please contact office for sooner follow up if symptoms do not improve or worsen or seek emergency care  Follow up Dr. Melvyn Novas  In 6 weeks and As needed

## 2014-02-05 NOTE — Progress Notes (Signed)
Chart and ov note reviewed/ agree with a/p

## 2014-02-05 NOTE — Assessment & Plan Note (Signed)
Recurrent exacerbation w/ associated left otitis media   Plan  Levaquin 500mg  daily for 10 days -take with food Eat yogurt , take probiotic while on antibiotics Prednisone taper today -use my taper -if not improved go back to med calendar prednisone instructions.  Use Zyrtec 10mg  At bedtime  For 5 days then As needed  Drainage.  Please contact office for sooner follow up if symptoms do not improve or worsen or seek emergency care  Follow up Dr. Melvyn Novas  In 6 weeks and As needed

## 2014-02-05 NOTE — Progress Notes (Signed)
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 May have helped some but did not help breathing or coughing severe to point of going to ER 2/9 no better on prednisone on this course, maybe better with ventolin last used 3 hours prior to OV . rec cycylical cough regimen Augmentin x 10 days If short of breath only use perforomist every 12 hours per neb   09/07/2011 f/u ov/Wert still smoking  Cough and sob Maybe better while on performist and worse when stopped it and no longer coughing up green stuff, only took pred a few days p ov then stopped.   rec Stop smoking completely - it's the most important aspect of your care! Mucinex dm 2 every every hours Symbicort 160 Take 2 puffs first thing in am and then another 2 puffs about 12 hours later.  - ok to use Prednisone 10 mg take  4 each am x 2 days,   2 each am x 2 days,  1 each am x2days and stop if not improving on symbicort Work on inhaler technique:   GERD diet Please schedule a follow up office visit in 2 weeks, sooner if needed with all medications including over the counters in hand.   10/12/2011 f/u ov/Wert cc Patient states cough is better since last visit. Patient also c/o chest tightness. Also states has cut back on smoking to about 3 a day.  No excess sputum or daytime need for saba, no limiting sob rec No change in your medications Completely stop smoking before  smoking completely stops you  Please schedule a follow up office visit in 6 weeks, call sooner if needed with pft's on return   11/29/2011 f/u ov/Wert  Still smoking cc much worse sob since April 20th severe cough >  Green mucus > amoxillicin and prednisone and cough syrup > no improvement at all.  AM of ov woke up at nl hour on day of ov and held all meds until ov for pft's. Does feel some better with albuterol hfa, no longer has neb albuterol.  No overt sinus or hb symptoms >>pred taper    01/24/2012 Follow up and med review  patient returns for a 6 week  followup for COPD She is brought all her medications and her review We reviewed all her medications and organized them into a medication calendar with patient education She has ran out [place] the inhaler Feels that she continues to have shortness of breath with activity  no increased cough or wheezing rec Restart Tudorza 1 puff Twice daily   Brush /rinse and gargle after use  May use Armour/hammer baking soda/peroxide toothpaste  Mycelex troches five times daily for 1 week for thrush Augmentin 875mg  Twice daily  For 7 days  Mucinex DM Twice daily  As needed  Cough/congestion  Follow med calendar closely  and bring to each visi   03/11/2012 Follow up COPD  Returns for 1 month follow up  Got some better but flared again , seen by PCP rx  augmentin and codeine cough syrup-for cough w green mucus last week--feels like she had since improved --spiriva inhaler started by Dr. Melvyn Novas  d/t insurance would not cover tudorza. Has not started spiriva yet.  Feeling better has few days of augmentin left.  No fever or edema  Still DOE and cough on/off .  rec Finish Augmentin as directed.  Continue on Spiriva and Symbicort > insurance required change  spiriva to atrovent  04/23/2012 f/u ov/Wert  Still smoking 2-3 cigs per day on symbicort/atrovent but tudorza has been approved and thinks it worked better.  Doe x one flight rec Please see patient  coordinator before you leave today  to schedule GI eval for bad heartburn Restart Caprice Renshaw one twice a day The key is to stop smoking completely before smoking completely stops you!  GERD diet  See calendar for specific medication instructions and bring it back for each and every office visit d  Late add: needs alpha one AT  [facility] next ov   03/16/2013 f/u ov/Wert stopped all meds March 2014 then downhill since then Chief Complaint  Patient presents with  . Follow-up    Pt states having increased SOB for the past 3 months.  She states has SOB with or without exertion. She has prod cough for the past 2 months-prod with green to yellow sputum.   still smoking but not daily. On acei with sensation of choking/throat irritation /sever cough not much better on aug/pred rx  >d/c ace , PPI /pepcid   04/07/2013 Follow up and med review  She is brought all her medications and her review We reviewed all her medications and organized them into a medication calendar with patient education It appears she is taking meds correctly.   Complains of  prod cough with thick green/yellow mucus, increased SOB, wheezing, chest tightness, hoarseness, some low grade temp x15 days Went to [facility] screening last week for hoarseness was  Informed throat was ok. But to come back for more detailed exam.  No hemoptysis, chest pain, orthopnea, edema.  Has lost 10 lbs over last year.  Last ov ACE was stopped , has not seen any change in hoarseness or cough.  Still smoking , cessation discussed.  rec Omnicef 300mg  Twice daily  For 7 days  Prednisone taper over next week.  Must quit smoking .  I will call with chest xray  And lab results.  Brush /rinse and gargle after use  May use Armour/hammer baking soda/peroxide toothpaste  Mucinex DM Twice daily  As needed  Cough/congestion  Follow med calendar closely and bring to each visit    04/24/2013 f/u ov/Wert did not bring med cal re: aecopd breathing  Bad since  2010    Chief Complaint  Patient presents with  . COPD    Breathing is getting worse. Saw TP on 03/16/13. Reports SOB, coughing with production of yellow mucus. Finished abx and pred taper with no relief.    Not even better with saba rec Prednisone 10 mg take  4 each am x 2 days,   2 each am x 2 days,  1 each am x 2 days and stop augmentin 875 twice daily x 10 days As per calendar:  For cough mucinex dm 2 every 12 hours and add flutter valve as much as you  can to help cough mucus up effectively     05/08/2013 f/u ov/Wert re: copd/ab still smoking, did not bring calendar but did bring most meds, not mucinex dm Chief Complaint  Patient presents with  . Follow-up    Pt states that her breathing had improved a great deal while on prednisone, and now is getting worse off of the medication. Her cough had also improved, but is now worse and is prod with minimal green sputum. She has been taking proair 2-3 times per day for the past wk.   >>pred taper and return for med calenar  06/29/2013 Follow up and Med Review  Patient returns for a two-month followup and medication review. Patient medications reviewed and organized into a medication calendar with patient education. Appears that she is taking her medications correctly. Says that she has developed  Cough, and congestion. Over the last week. Patient, says she's had family members with similar symptoms. She complains of hoarseness, prod cough with yellow/thick green mucus, wheezing, increased SOB at times x1week.  rec Augmentin 875mg   Twice daily  For 7 days  Prednisone taper over next week.  Must quit smoking .  Brush /rinse and gargle after use  May use Armour/hammer baking soda/peroxide toothpaste  Mucinex DM Twice daily  As needed  Cough/congestion   07/22/2013 acute  ov/Wert re: aecopd, still smoking Chief Complaint  Patient presents with  . Acute Visit    Still having sob, wheezing, chest tightness and cough with green mucus x  3weeks and worsening  contact with another sick member of fm since last flare but never cleared up from the prev ov.  rx already by [facility] with minocyclin and prednisone since 07/18/13  Using proair up to twice daily then abandoned in favor of neb  >>pred burst/maintenence , xanax rx for anxiety  and mso4 for severe cough   08/06/2013 Follow up an med review  Pt returns for follow up and med review  Patient medications reviewed and organized into a medication calendar with patient education. Appears that she is taking her medications correctly. Had COPD flare last ov , tx w/ abx and steroids  Says she was feeling no better. Went back to PCP last week, started on zpack -last dose is tomorrow.  Still has thrush.  Currently on prednisone 20mg  daily  No hemoptysis , chest pain., orthopnea, or fever.  Has a lot of anxiety, stress at home. Feels anxiety makes her worse.  Husband with her today rec Must quit smoking .  Remain on Prednisone 20mg  until back to your baseline then back to 10mg   Xray and labs today.  Diflucan 100mg  2 first day then 1 daily until gone.  Follow med calendar closely and bring to each visit    08/28/2013 f/u ov/Wert re: worse cough x 2 months/ still smoking / using med calendar / on 20 mg prednisone Chief Complaint  Patient presents with  . Follow-up    Pt still SOB, prod cough with green mucous.     coughing greeen since Xmas esp in am, some better p saba hfa but insists on using the neb first. >>augmentin x 10 d   10/30/13 Follow up and Med Review  Patient returns for a followup and medication review. Reviewed all her medications organized them into a medication calendar. Patient education. Patient has not smoked, encouraged on continued smoking cessation. Last visit. Patient had a slow to resolve bronchitis/ sinusitis She was treated with Augmentin for 10 days The symptoms are  much improved She denies any hemoptysis, chest pain, orthopnea, PND, or leg  swelling.  >>med calendar    12/14/2013 Acute OV sick since first week in May  Pt complains of increased SOB, wheezing, hoarseness, prod cough with green/yellow mucus for 6 weeks.  Waxing and waning  With congestion.  No fever for last few weeks.  Saw  PCP at onset and has finished 2 rounds augmentin.  fever at onset.  Last abx finished 3 days ago.  Wheezing is worse this last week.  No hemoptysis, chest pain , abd pain, n/v/d.  Husband getting surgery this week.  Mucinex DM is not helping.  Still smoking  rec Chest xray today  Prednisone taper over next week.  Mucinex DM Twice daily  As needed  Cough/congestion   12/25/2013 f/u ov/Wert re: exac not back to baseline since first week in May / denies smoking in 08/2013  Chief Complaint  Patient presents with  . ACUTE OFFICE VISIT    Pt c/o increased cough with thick green mucus. No improvement since OV with TP. Pt has completed 2 rounds of abx and 1 pred taper.  sob now walking on flat, can't do grocer shopping  Very congested cough  Sleeping in recliner was back in bed until acute illess  Using plan B and C together. >>CXR -chronic changes , pred taper   02/05/2014 Follow up and Med Calendar  Patient returns for a 6 week followup and medication review. We reviewed all her medications organized them into a medication calendar with patient education. Appears that she is taking her medications correctly. Patient denies any hemoptysis, orthopnea, PND, or leg swelling. Says she still does not feel well , continues to cough up congestion w/ thick green mucus.  Complains of increased SOB, wheezing, prod cough with green mucus, tightness since last ov.  Complains of left ear fullness . Was started on Prednisone last visit but has not been able to taper off, remains on Prednisone 20mg  daily        Current Medications, Allergies, Complete Past Medical History, Past Surgical History, Family History, and Social History were reviewed in  [employer] record.  ROS  The following are not active complaints unless bolded sore throat, dysphagia, dental problems, itching, sneezing,   , ear ache,   fever, chills, sweats, unintended wt loss, pleuritic or exertional cp, hemoptysis,  orthopnea pnd or leg swelling, presyncope, palpitations, heartburn, abdominal pain, anorexia, nausea, vomiting, diarrhea  or change in bowel or urinary habits, change in stools or urine, dysuria,hematuria,  rash, arthralgias, visual complaints, headache, numbness weakness or ataxia or problems with walking or coordination,  change in mood/affect or memory.                Objective:   Physical Exam   08/23/2011  Wt 145 >   149 01/24/2012 >  144 03/11/2012 > 04/23/2012  142 > 03/16/2013  130 >04/07/2013 > 132 04/24/13 > 134 05/08/2013 >134 06/29/2013 > 07/22/2013 130 > 08/28/2013 135 >138 10/30/13 >136 12/14/2013 >  12/25/2013 133 >02/05/2014 130     NAD    HEENT mild turbinate edema.  Oropharynx clear , very poor dentition, non tender sinus , left EAC clear, TM bulging onleft.   NECK:  Supple w/ fair ROM; no JVD; normal carotid impulses w/o bruits; no thyromegaly or nodules palpated; no lymphadenopathy.  RESP  Few bilateral exp wheezes,  no accessory muscle use, no dullness to percussion  CARD:  RRR, no m/r/g  ,  no peripheral edema, pulses intact, no cyanosis or clubbing.  GI:   Soft & nt; nml bowel sounds; no organomegaly or masses detected.  Musco: Warm bil, no deformities or joint swelling noted.   Neuro: alert, no focal deficits noted.    Skin: Warm, no lesions or rashes   cxr 12/14/13 Stable chest. No acute cardiopulmonary process.        Assessment & Plan:

## 2014-02-08 ENCOUNTER — Ambulatory Visit: Payer: Medicaid Other | Admitting: Internal Medicine

## 2014-02-16 ENCOUNTER — Encounter: Payer: Self-pay | Admitting: Internal Medicine

## 2014-02-16 ENCOUNTER — Ambulatory Visit (INDEPENDENT_AMBULATORY_CARE_PROVIDER_SITE_OTHER): Payer: Medicaid Other | Admitting: Internal Medicine

## 2014-02-16 VITALS — BP 104/68 | HR 104 | Temp 98.7°F | Ht 58.5 in | Wt 133.0 lb

## 2014-02-16 DIAGNOSIS — R05 Cough: Secondary | ICD-10-CM

## 2014-02-16 DIAGNOSIS — R0902 Hypoxemia: Secondary | ICD-10-CM

## 2014-02-16 DIAGNOSIS — R059 Cough, unspecified: Secondary | ICD-10-CM

## 2014-02-16 DIAGNOSIS — J4489 Other specified chronic obstructive pulmonary disease: Secondary | ICD-10-CM

## 2014-02-16 DIAGNOSIS — J449 Chronic obstructive pulmonary disease, unspecified: Secondary | ICD-10-CM

## 2014-02-16 MED ORDER — TIOTROPIUM BROMIDE MONOHYDRATE 2.5 MCG/ACT IN AERS: INHALATION_SPRAY | RESPIRATORY_TRACT | Status: DC

## 2014-02-16 MED ORDER — PREDNISONE 10 MG PO TABS: ORAL_TABLET | ORAL | Status: DC

## 2014-02-16 NOTE — Assessment & Plan Note (Addendum)
-   PFT's 11/29/2011 FEV1  1.08 (46%) ratio 43 and 15 % better p B2   - HFA 75% 11/29/2011 > added Tudorza>changed to spiriva /insurance > changed back to tudorza 04/23/2012    -alpha 1 04/07/2013 >175 MM    -med calendar 10/30/13 .02/05/2014   - added contingenncy for pred 20 and taper off any time she starts to use Plan C = neb more than rarely  - 02/16/2014 p extensive coaching HFA effectiveness =    50% (ti too short)    DDX of  difficult airways management all start with A and  include Adherence, Ace Inhibitors, Acid Reflux, Active Sinus Disease, Alpha 1 Antitripsin deficiency, Anxiety masquerading as Airways dz,  ABPA,  allergy(esp in young), Aspiration (esp in elderly), Adverse effects of DPI,  Active smokers, plus two Bs  = Bronchiectasis and Beta blocker use..and one C= CHF  Adherence is always the initial "prime suspect" and is a multilayered concern that requires a "trust but verify" approach in every patient - starting with knowing how to use medications, especially inhalers, correctly, keeping up with refills and understanding the fundamental difference between maintenance and prns vs those medications only taken for a very short course and then stopped and not refilled.  - seems to be doing better with med calendar  - The proper method of use, as well as anticipated side effects, of a metered-dose inhaler are discussed and demonstrated to the patient. Improved effectiveness after extensive coaching during this visit to a level of approximately  75%   ? Active sinus Dz >  Sinus ct  ? Adverse effect of dpi > try spiriva respimat    Each maintenance medication was reviewed in detail including most importantly the difference between maintenance and as needed and under what circumstances the prns are to be used. This was done in the context of a medication calendar review which provided the patient with a user-friendly unambiguous mechanism for medication administration and reconciliation and  provides an action plan for all active problems. It is critical that this be shown to every doctor  for modification during the office visit if necessary so the patient can use it as a working document.

## 2014-02-16 NOTE — Progress Notes (Signed)
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 Follow-up    Pt states having increased SOB for the past 3 months.  She states has SOB with or without exertion. She has prod cough for the past 2 months-prod with green to yellow sputum.   still smoking but not daily. On acei with sensation of choking/throat irritation /sever cough not much better on aug/pred rx  >d/c ace , PPI /pepcid   04/07/2013 Follow up and med review  She is brought all her medications and her review We reviewed all her medications and organized them into a medication calendar with patient education It appears she is taking meds correctly.   Complains of  prod cough with thick green/yellow mucus, increased SOB, wheezing, chest tightness, hoarseness, some low grade temp x15 days Went to ENT [place] screening last week for hoarseness was  Informed throat was ok. But to come back for more detailed exam.  No hemoptysis, chest pain, orthopnea, edema.  Has lost 10 lbs over last year.  Last ov ACE was stopped , has not seen any change in hoarseness or cough.  Still smoking , cessation discussed.  rec Omnicef 300mg  Twice daily  For 7 days  Prednisone taper over next week.  Must quit smoking .  I will call with chest xray  And lab results.  Brush /rinse and gargle after use  May use Armour/hammer baking soda/peroxide toothpaste  Mucinex DM Twice daily  As needed  Cough/congestion   Follow med calendar closely and bring to each visit      08/28/2013 f/u ov/Cody Oliger re: worse cough x 2 months/ still smoking / using med calendar / on 20 mg prednisone Chief Complaint  Patient presents with  . Follow-up    Pt still SOB, prod cough with green mucous.     coughing greeen since Xmas esp in am, some better p saba hfa but insists on using the neb first. >>augmentin x 10 d   10/30/13 Follow up and Med Review  Patient returns for a followup and medication review. Reviewed all her medications organized them into a medication calendar. Patient education. Patient has not smoked, encouraged on continued smoking cessation. Last visit. Patient had a slow to resolve bronchitis/ sinusitis She was treated with Augmentin for 10 days The symptoms are much improved She denies any hemoptysis, chest pain, orthopnea, PND, or leg swelling.  >>med calendar    12/14/2013 Acute OV sick since first week in May  Pt complains of increased SOB, wheezing, hoarseness, prod cough with green/yellow mucus for 6 weeks.  Waxing and waning  With congestion.  No fever for last few weeks.  Saw  PCP at onset and has finished 2 rounds augmentin.  fever at onset.  Last abx finished 3 days ago.  Wheezing is worse this last week.  No hemoptysis, chest pain , abd pain, n/v/d.  Husband getting surgery this week.  Mucinex DM is not helping.  Still smoking  rec Chest xray today  Prednisone taper over next week.  Mucinex DM Twice daily  As needed  Cough/congestion      02/05/2014 Follow up and Med Calendar  Patient returns for a 6 week followup and medication review. We reviewed all her medications organized them into a medication calendar with patient education. Appears that she is taking her medications correctly. Patient denies any hemoptysis, orthopnea, PND, or leg swelling. Says she still does not feel well , continues to cough up congestion w/ thick green mucus.  Complains of increased SOB, wheezing,  prod cough with green mucus, tightness since last ov.  Complains of left ear fullness . Was started on Prednisone last visit but has not been able to taper off, remains on Prednisone 20mg  daily  rec Levaquin 500mg  daily for 10 days -take with food Eat yogurt , take probiotic while on antibiotics Prednisone taper today -use my taper -if not improved go back to med calendar prednisone instructions.  Use Zyrtec 10mg  At bedtime  For 5 days then As needed  Drainage.  Please contact office for sooner follow up if symptoms do not improve or worsen or seek emergency care    02/16/2014 f/u ov/Jahaira Earnhart re: copd/ not back to baseline since early May 2015  Chief Complaint  Patient presents with  . Acute Visit    Pt c/o ear pain and prod cough with green sputum- symptoms are no better since last visit with TP on 02/05/14. She is using albuterol in her neb every 4 hours.   does have med calendar and appears to correlate with meds Denies smoking and husband confirms Behavior in office erratic  C/o sob x 10 ft, not reproducible C/o constant cough with green mucus, none during interview exam or walk  No obvious day to day or daytime variabilty or assoc  cp or chest tightness, subjective wheeze    or hb symptoms. No unusual exp hx or h/o childhood pna/ asthma or knowledge of premature birth.  Sleeping ok without nocturnal  or early am exacerbation  of respiratory  c/o's or need for noct saba. Also denies any obvious fluctuation of symptoms with weather or environmental changes or other aggravating or alleviating factors except as outlined above   Current Medications, Allergies, Complete Past Medical History, Past Surgical History, Family History, and Social History were reviewed in [employer] record.  ROS  The following are not active complaints unless bolded sore throat, dysphagia, dental problems, itching, sneezing,  nasal congestion or excess/ purulent secretions, ear ache,   fever,  chills, sweats, unintended wt loss, pleuritic or exertional cp, hemoptysis,  orthopnea pnd or leg swelling, presyncope, palpitations, heartburn, abdominal pain, anorexia, nausea, vomiting, diarrhea  or change in bowel or urinary habits, change in stools or urine, dysuria,hematuria,  rash, arthralgias, visual complaints, headache, numbness weakness or ataxia or problems with walking or coordination,  change in mood/affect or memory.         Objective:   Physical Exam   08/23/2011  Wt 145 >   149 01/24/2012 >  144 03/11/2012 > 04/23/2012  142 > 03/16/2013  130 >04/07/2013 > 132 04/24/13 > 134 05/08/2013 >134 06/29/2013 > 07/22/2013 130 > 08/28/2013 135 >138 10/30/13 >136 12/14/2013 >  12/25/2013 133 >02/05/2014 130 > 02/16/2014   133    NAD    HEENT mild turbinate edema.  Oropharynx clear , very poor dentition, non  tender sinus , left EAC clear, TM bulging onleft.   NECK:  Supple w/ fair ROM; no JVD; normal carotid impulses w/o bruits; no thyromegaly or nodules palpated; no lymphadenopathy.  RESP  Few bilateral exp wheezes,  no accessory muscle use, no dullness to percussion  CARD:  RRR, no m/r/g  , no peripheral edema, pulses intact, no cyanosis or clubbing.  GI:   Soft & nt; nml bowel sounds; no organomegaly or masses detected.  Musco: Warm bil, no deformities or joint swelling noted.   Neuro: alert, no focal deficits noted.    Skin: Warm, no lesions or rashes   cxr 12/14/13 Stable chest. No acute cardiopulmonary process.        Assessment & Plan:

## 2014-02-16 NOTE — Patient Instructions (Addendum)
Stop tudorza Start spiriva respimat 2 pffs each am  Please see patient coordinator before you leave today  to schedule sinus ct  See calendar for specific medication instructions and bring it back for each and every office visit for every healthcare provider you see.  Without it,  you may not receive the best quality medical care that we feel you deserve.  You will note that the calendar groups together  your maintenance  medications that are timed at particular times of the day.  Think of this as your checklist for what your doctor has instructed you to do until your next evaluation to see what benefit  there is  to staying on a consistent group of medications intended to keep you well.  The other group at the bottom is entirely up to you to use as you see fit  for specific symptoms that may arise between visits that require you to treat them on an as needed basis.  Think of this as your action plan or "what if" list.   Separating the top medications from the bottom group is fundamental to providing you adequate care going forward.    Follow up one month.

## 2014-02-17 ENCOUNTER — Telehealth: Payer: Self-pay | Admitting: Internal Medicine

## 2014-02-17 DIAGNOSIS — J449 Chronic obstructive pulmonary disease, unspecified: Secondary | ICD-10-CM

## 2014-02-17 DIAGNOSIS — R0902 Hypoxemia: Secondary | ICD-10-CM | POA: Insufficient documentation

## 2014-02-17 NOTE — Assessment & Plan Note (Signed)
02/16/2014   Walked RA x one lap @ 185 stopped due to  Sob with sat 88%  desat only occurred at a distant 4 x what she reports doing at at home so unlikely to be helpful here

## 2014-02-17 NOTE — Telephone Encounter (Signed)
lmtcb x1 

## 2014-02-17 NOTE — Telephone Encounter (Signed)
Pt aware we will send to correct DME. Staff message sent to [facility] to cancel order sent to [facility].  Also per pt her spiriva respimat 2 pffs each am and this is not covered by medicaid. Pt does have the sample MW gave her. Spiriva resp will need PA.   Please advise MW thanks

## 2014-02-17 NOTE — Telephone Encounter (Signed)
Pt returned call Another sample of Spiriva Respimat provided to pt It is documented that Medicaid requires PA >> typically they will cover if pt has tried an failed capsule Spiriva.  Per pt's chart, she tried and failed this in 2013 and was then changed to [place] at the 10.16.13 ov.  If this medication works for pt and MW decides to move forward with PA, this information should help.  Pt will keep her CT sinus appt for 8.14.15  Nothing further needed at this time; will sign off.

## 2014-02-17 NOTE — Telephone Encounter (Signed)
We need to determine whether it's working on not first so ok to give second sample until returns for f/u

## 2014-02-17 NOTE — Assessment & Plan Note (Signed)
-   trial off tudorza 02/16/2014 > off dpi 02/16/14  - sinus ct 02/16/2014 >>>  Has failed to turn mucus clear with every available outpt abx but no purulent mucus produced so no further abx as risk resistance/pmc

## 2014-02-19 ENCOUNTER — Encounter: Payer: Self-pay | Admitting: Internal Medicine

## 2014-02-19 ENCOUNTER — Other Ambulatory Visit: Payer: Self-pay | Admitting: Internal Medicine

## 2014-02-19 ENCOUNTER — Ambulatory Visit (INDEPENDENT_AMBULATORY_CARE_PROVIDER_SITE_OTHER)
Admission: RE | Admit: 2014-02-19 | Discharge: 2014-02-19 | Disposition: A | Discharge status: Home / Self Care | Payer: Medicaid Other | Source: Ambulatory Visit | Attending: Internal Medicine | Admitting: Internal Medicine

## 2014-02-19 DIAGNOSIS — R059 Cough, unspecified: Secondary | ICD-10-CM

## 2014-02-19 DIAGNOSIS — R05 Cough: Secondary | ICD-10-CM

## 2014-02-19 MED ORDER — LEVOFLOXACIN 750 MG PO TABS: 750.0000 mg | ORAL_TABLET | Freq: Every day | ORAL | Status: DC

## 2014-02-19 NOTE — Progress Notes (Signed)
Quick Note:  Spoke with pt and notified of results per Dr. Wert. Pt verbalized understanding and denied any questions.  ______ 

## 2014-02-24 ENCOUNTER — Telehealth: Payer: Self-pay | Admitting: Internal Medicine

## 2014-02-24 NOTE — Telephone Encounter (Signed)
Spoke with Gerald Stabs-- aware that at this time we are going to hold off on the Spiriva Resp until the patient follows up with Dr Melvyn Novas to ensure tolerance.  Nothing further needed.

## 2014-03-02 ENCOUNTER — Telehealth: Payer: Self-pay | Admitting: Internal Medicine

## 2014-03-02 MED ORDER — ALPRAZOLAM 0.5 MG PO TABS: ORAL_TABLET | ORAL | Status: DC

## 2014-03-02 NOTE — Telephone Encounter (Signed)
Called and spoke with pt and she is aware of rx that has been called to the pharmacy per pts request. She did get her med cal

## 2014-03-02 NOTE — Telephone Encounter (Signed)
Last rx written on 02-05-14 for xanax 0.5 take 1 every 6 hours as needed #60. Pt is requesting a refill. Please advise. Ferriday Bing, [place] calender copy placed at front per pt request.

## 2014-03-02 NOTE — Telephone Encounter (Signed)
Pt called back.  Pt states she needs a refill on alprazolam, 0.5 mg, called into [facility] in [facility].  [name] Pt would like to have husband p/u around noon today.  [name]

## 2014-03-02 NOTE — Telephone Encounter (Signed)
[place] for one refill, needs ov with tammy before more

## 2014-03-03 ENCOUNTER — Telehealth: Payer: Self-pay | Admitting: Internal Medicine

## 2014-03-03 NOTE — Telephone Encounter (Signed)
Called Dr. Arelia Sneddon  Office and they stated that the pt wants to get a second opinion and Dr. Arelia Sneddon wanted to know who MW would recs for this.  MW please advise. Thanks

## 2014-03-03 NOTE — Telephone Encounter (Signed)
Fine to refer her to any of my colleagues - she does not need referral to a tertiary clinic in my opinion

## 2014-03-03 NOTE — Telephone Encounter (Signed)
Called Patrick and office was closed  Will try back tomorrow

## 2014-03-04 NOTE — Telephone Encounter (Signed)
I spoke with [name] and she place me on hold and spoke with the pt and she wants to be sent to a different practice, not have the 2nd opinion within the same practice. She states she will discuss with Dr. Arelia Sneddon and the pt and they will make the referral.[name] [place], [place]

## 2014-03-17 ENCOUNTER — Ambulatory Visit (INDEPENDENT_AMBULATORY_CARE_PROVIDER_SITE_OTHER): Payer: Medicaid Other | Admitting: Internal Medicine

## 2014-03-17 ENCOUNTER — Encounter: Payer: Self-pay | Admitting: Internal Medicine

## 2014-03-17 VITALS — BP 130/84 | HR 102 | Temp 98.2°F | Ht 58.5 in | Wt 128.0 lb

## 2014-03-17 DIAGNOSIS — R0902 Hypoxemia: Secondary | ICD-10-CM

## 2014-03-17 DIAGNOSIS — J449 Chronic obstructive pulmonary disease, unspecified: Secondary | ICD-10-CM

## 2014-03-17 NOTE — Patient Instructions (Addendum)
[place] to use if you want to at rest but always use 2lpm at bedtime and 3lpm with activity   Work on inhaler technique:  relax and gently blow all the way out then take a nice smooth deep breath back in, triggering the inhaler at same time you start breathing in.  Hold for up to 5 seconds if you can.  Rinse and gargle with water when done  See calendar for specific medication instructions and bring it back for each and every office visit for every healthcare provider you see.  Without it,  you may not receive the best quality medical care that we feel you deserve.  You will note that the calendar groups together  your maintenance  medications that are timed at particular times of the day.  Think of this as your checklist for what your doctor has instructed you to do until your next evaluation to see what benefit  there is  to staying on a consistent group of medications intended to keep you well.  The other group at the bottom is entirely up to you to use as you see fit  for specific symptoms that may arise between visits that require you to treat them on an as needed basis.  Think of this as your action plan or "what if" list.   Separating the top medications from the bottom group is fundamental to providing you adequate care going forward.    If you wish to continue under my care, return to see Tammy NP with all the meds we gave you today plus any new ones you add separated into maintenance vs as needed to verify you are taking them correctly  And then she will set you up with me  Late add symbicort on 60 and turdorza on 50  Check nicotine urine next ov

## 2014-03-17 NOTE — Progress Notes (Signed)
Subjective:    Patient ID: [name]    DOB: 12-15-1970    MRN: [id number]   Brief patient profile:  54  yowf smoker used 02 tent as infant and in 4th or 5th grade then did fine thru HS cheerleading and tol ex well then except  much worse sob  assoc with episodes of severe bronchitis then even in between episodes of  Bronchitis developed chronic doe around  2010 referred 08/23/2011 by Dr Arelia Sneddon to pulmonary clinic with GOLD II/III COPD by pft's 11/29/2011    HPI  03/16/2013 f/u ov/[name] stopped all meds March 2014 then downhill since then Chief Complaint  Patient presents with  . Follow-up    Pt states having increased SOB for the past 3 months.  She states has SOB with or without exertion. She has prod cough for the past 2 months-prod with green to yellow sputum.   still smoking but not daily. On acei with sensation of choking/throat irritation /sever cough not much better on aug/pred rx  >d/c ace , PPI /pepcid   04/07/2013 Follow up and med review  She is brought all her medications and her review We reviewed all her medications and organized them into a medication calendar with patient education It appears she is taking meds correctly.   Complains of  prod cough with thick green/yellow mucus, increased SOB, wheezing, chest tightness, hoarseness, some low grade temp x15 days Went to ENT [place] screening last week for hoarseness was  Informed throat was ok. But to come back for more detailed exam.  No hemoptysis, chest pain, orthopnea, edema.  Has lost 10 lbs over last year.  Last ov ACE was stopped , has not seen any change in hoarseness or cough.  Still smoking , cessation discussed.  rec Omnicef 300mg  Twice daily  For 7 days  Prednisone taper over next week.  Must quit smoking .  I will call with chest xray  And lab results.  Brush /rinse and gargle after use  May use Armour/hammer baking soda/peroxide toothpaste  Mucinex DM Twice daily  As needed  Cough/congestion   Follow med calendar closely and bring to each visit      08/28/2013 f/u ov/[name] re: worse cough x 2 months/ still smoking / using med calendar / on 20 mg prednisone Chief Complaint  Patient presents with  . Follow-up    Pt still SOB, prod cough with green mucous.     coughing greeen since Xmas esp in am, some better p saba hfa but insists on using the neb first. >>augmentin x 10 d   10/30/13 Follow up and Med Review  Patient returns for a followup and medication review. Reviewed all her medications organized them into a medication calendar. Patient education. Patient has not smoked, encouraged on continued smoking cessation. Last visit. Patient had a slow to resolve bronchitis/ sinusitis She was treated with Augmentin for 10 days The symptoms are much improved She denies any hemoptysis, chest pain, orthopnea, PND, or leg swelling.  >>med calendar    12/14/2013 Acute OV sick since first week in May  Pt complains of increased SOB, wheezing, hoarseness, prod cough with green/yellow mucus for 6 weeks.  Waxing and waning  With congestion.  No fever for last few weeks.  Saw  PCP at onset and has finished 2 rounds augmentin.  fever at onset.  Last abx finished 3 days ago.  Wheezing is worse this last week.  No hemoptysis, chest pain , abd pain, n/v/d.  Husband getting surgery this week.  Mucinex DM is not helping.  Still smoking  rec Chest xray today  Prednisone taper over next week.  Mucinex DM Twice daily  As needed  Cough/congestion      02/05/2014 Follow up and Med Calendar  Patient returns for a 6 week followup and medication review. We reviewed all her medications organized them into a medication calendar with patient education. Appears that she is taking her medications correctly. Patient denies any hemoptysis, orthopnea, PND, or leg swelling. Says she still does not feel well , continues to cough up congestion w/ thick green mucus.  Complains of increased SOB, wheezing,  prod cough with green mucus, tightness since last ov.  Complains of left ear fullness . Was started on Prednisone last visit but has not been able to taper off, remains on Prednisone 20mg  daily  rec Levaquin 500mg  daily for 10 days -take with food Eat yogurt , take probiotic while on antibiotics Prednisone taper today -use my taper -if not improved go back to med calendar prednisone instructions.  Use Zyrtec 10mg  At bedtime  For 5 days then As needed  Drainage.  Please contact office for sooner follow up if symptoms do not improve or worsen or seek emergency care    02/16/2014 f/u ov/[name] re: copd/ not back to baseline since early May 2015  Chief Complaint  Patient presents with  . Acute Visit    Pt c/o ear pain and prod cough with green sputum- symptoms are no better since last visit with TP on 02/05/14. She is using albuterol in her neb every 4 hours.   does have med calendar and appears to correlate with meds Denies smoking and husband confirms Behavior in office erratic C/o sob x 10 ft, not reproducible C/o constant cough with green mucus, none during interview exam or walk rec Stop tudorza Start spiriva respimat 2 pffs each am Please see patient coordinator before you leave today  to schedule sinus ct See calendar for specific medication instructions   Admit 8/18 = 02/27/14 for aecopd with mild hypercarbia and CTa neg x emphysema   03/17/2014 post hosp f/u/ re-establish  ov/[name] re: copd/f/u denies smoking  Chief Complaint  Patient presents with  . HFU    Pt states recently d/c'ed from [facility] for URI. She states that since she was discharged, her breathing is no better. She is using albuterol inhaler 3-4 times per day and albuterol neb approx 6 times per day.  feels like losing ground again since d/c from [facility] and treated as aecopd [place] hx will not answer specific questions "I know my body"  ? When were you last able to walk comfortably A last summer (2014)  able to walk at mall comfortably s 02.  Cough greenish tint / no better on abx  symbicort empty/ no mucinex   No obvious day to day or daytime variabilty or assoc  cp or chest tightness, subjective wheeze    or hb symptoms. No unusual exp hx or h/o childhood pna/ asthma or knowledge of premature birth.  Sleeping ok without nocturnal  or early am exacerbation  of respiratory  c/o's or need for noct saba. Also denies any obvious fluctuation of symptoms with weather or environmental changes or other aggravating or alleviating factors except as outlined above   Current Medications, Allergies, Complete Past Medical History, Past Surgical History, Family History, and Social History were reviewed in [employer] record.  ROS  The following are  not active complaints unless bolded sore throat, dysphagia, dental problems, itching, sneezing,  nasal congestion or excess/ purulent secretions, ear ache,   fever, chills, sweats, unintended wt loss, pleuritic or exertional cp, hemoptysis,  orthopnea pnd or leg swelling, presyncope, palpitations, heartburn, abdominal pain, anorexia, nausea, vomiting, diarrhea  or change in bowel or urinary habits, change in stools or urine, dysuria,hematuria,  rash, arthralgias, visual complaints, headache, numbness weakness or ataxia or problems with walking or coordination,  change in mood/affect or memory.         Objective:   Physical Exam   08/23/2011  Wt 145 >   149 01/24/2012 >  144 03/11/2012 > 04/23/2012  142 > 03/16/2013  130 >04/07/2013 > 132 04/24/13 > 134 05/08/2013 >134 06/29/2013 > 07/22/2013 130 > 08/28/2013 135 >138 10/30/13 >136 12/14/2013 >  12/25/2013 133 >02/05/2014 130 > 02/16/2014   133 > 9?9/15  128     NAD    HEENT mild turbinate edema.  Oropharynx clear , very poor dentition, non tender sinus , left EAC clear, TMs ok    NECK:  Supple w/ fair ROM; no JVD; normal carotid impulses w/o bruits; no thyromegaly or nodules palpated; no  lymphadenopathy.  RESP  Few bilateral exp wheezes,  no accessory muscle use, no dullness to percussion  CARD:  RRR, no m/r/g  , no peripheral edema, pulses intact, no cyanosis or clubbing.  GI:   Soft & nt; nml bowel sounds; no organomegaly or masses detected.  Musco: Warm bil, no deformities or joint swelling noted.   Neuro: alert, no focal deficits noted.    Skin: Warm, no lesions or rashes   CTa 02/24/14 emphysema only         Assessment & Plan:

## 2014-03-19 ENCOUNTER — Ambulatory Visit: Payer: Medicaid Other | Admitting: Internal Medicine

## 2014-03-19 NOTE — Assessment & Plan Note (Signed)
-   PFT's 11/29/2011 FEV1  1.08 (46%) ratio 43 and 15 % better p B2   - HFA 75% 11/29/2011 > added Tudorza>changed to spiriva /insurance > changed back to tudorza 04/23/2012    -alpha 1 04/07/2013 >175 MM    -med calendar 10/30/13 .02/05/2014   - added contingenncy for pred 20 and taper off any time she starts to use Plan C = neb more than rarely  -  03/17/2014 p extensive coaching HFA effectiveness =    50% (ti too short)   DDX of  difficult airways management all start with A and  include Adherence, Ace Inhibitors, Acid Reflux, Active Sinus Disease, Alpha 1 Antitripsin deficiency, Anxiety masquerading as Airways dz,  ABPA,  allergy(esp in young), Aspiration (esp in elderly), Adverse effects of DPI,  Active smokers, plus two Bs  = Bronchiectasis and Beta blocker use..and one C= CHF  Adherence is always the initial "prime suspect" and is a multilayered concern that requires a "trust but verify" approach in every patient - starting with knowing how to use medications, especially inhalers, correctly, keeping up with refills and understanding the fundamental difference between maintenance and prns vs those medications only taken for a very short course and then stopped and not refilled.  - def need a trust but verify approach here > note symbicort chamber on 0  "I didn't bring the one I'm using " - The proper method of use, as well as anticipated side effects, of a metered-dose inhaler are discussed and demonstrated to the patient. Improved effectiveness after extensive coaching during this visit to a level of approximately  50% and if can't do better needs change to bud/perforomist as dpi not likely to be well tolerated in setting of chronic poorly controlled cough  ? Acid (or non-acid) GERD > always difficult to exclude as up to 75% of pts in some series report no assoc GI/ Heartburn symptoms> rec continue max (24h)  acid suppression and diet restrictions/ reviewed    ? Active smoking > she denies but if  continues to do poorly needs urine screen next ov   ? Anxiety > typically dx of exclusion but much higher on her list > reviewed prn rx

## 2014-03-20 NOTE — Assessment & Plan Note (Addendum)
-   02/16/2014   Walked RA x one lap @ 185 stopped due to  Sob with sat 88% - 03/17/2014   Walked RA x 15ft stopped due to  desat to [place] on 3lpm   Rec as if 03/17/14  sleep on 2lpm and walk on 3lpm, RA at rest ok

## 2014-03-26 LAB — PULMONARY FUNCTION TEST

## 2014-03-30 ENCOUNTER — Telehealth: Payer: Self-pay | Admitting: Internal Medicine

## 2014-03-30 MED ORDER — ALPRAZOLAM 0.5 MG PO TABS: ORAL_TABLET | ORAL | Status: DC

## 2014-03-30 NOTE — Telephone Encounter (Signed)
Ok to refill 

## 2014-03-30 NOTE — Telephone Encounter (Signed)
Pt aware that Rx has been called to pharmacy voicemail and nothing more needed at this time.

## 2014-03-30 NOTE — Telephone Encounter (Signed)
Pt last had alprazolam refilled 03/03/15 #60 One every 6 hours if anxious related to breathing Please advise MW thanks

## 2014-03-31 ENCOUNTER — Ambulatory Visit: Payer: Medicaid Other | Admitting: Adult Health

## 2014-04-13 ENCOUNTER — Encounter: Payer: Self-pay | Admitting: Internal Medicine

## 2014-04-19 ENCOUNTER — Ambulatory Visit (INDEPENDENT_AMBULATORY_CARE_PROVIDER_SITE_OTHER): Payer: Medicaid Other | Admitting: Adult Health

## 2014-04-19 ENCOUNTER — Encounter: Payer: Self-pay | Admitting: Adult Health

## 2014-04-19 ENCOUNTER — Other Ambulatory Visit: Payer: Medicaid Other

## 2014-04-19 ENCOUNTER — Other Ambulatory Visit: Payer: Self-pay | Admitting: Internal Medicine

## 2014-04-19 VITALS — BP 114/82 | HR 100 | Temp 98.3°F | Ht 58.5 in | Wt 130.2 lb

## 2014-04-19 DIAGNOSIS — J449 Chronic obstructive pulmonary disease, unspecified: Secondary | ICD-10-CM

## 2014-04-19 DIAGNOSIS — Z23 Encounter for immunization: Secondary | ICD-10-CM

## 2014-04-19 NOTE — Progress Notes (Signed)
Subjective:    Patient ID: [name]    DOB: 12-15-1970    MRN: [id number]   Brief patient profile:  54  yowf smoker used 02 tent as infant and in 4th or 5th grade then did fine thru HS cheerleading and tol ex well then except  much worse sob  assoc with episodes of severe bronchitis then even in between episodes of  Bronchitis developed chronic doe around  2010 referred 08/23/2011 by Dr Arelia Sneddon to pulmonary clinic with GOLD II/III COPD by pft's 11/29/2011    HPI  03/16/2013 f/u ov/Wert stopped all meds March 2014 then downhill since then Chief Complaint  Patient presents with  . Follow-up    Pt states having increased SOB for the past 3 months.  She states has SOB with or without exertion. She has prod cough for the past 2 months-prod with green to yellow sputum.   still smoking but not daily. On acei with sensation of choking/throat irritation /sever cough not much better on aug/pred rx  >d/c ace , PPI /pepcid   04/07/2013 Follow up and med review  She is brought all her medications and her review We reviewed all her medications and organized them into a medication calendar with patient education It appears she is taking meds correctly.   Complains of  prod cough with thick green/yellow mucus, increased SOB, wheezing, chest tightness, hoarseness, some low grade temp x15 days Went to ENT [place] screening last week for hoarseness was  Informed throat was ok. But to come back for more detailed exam.  No hemoptysis, chest pain, orthopnea, edema.  Has lost 10 lbs over last year.  Last ov ACE was stopped , has not seen any change in hoarseness or cough.  Still smoking , cessation discussed.  rec Omnicef 300mg  Twice daily  For 7 days  Prednisone taper over next week.  Must quit smoking .  I will call with chest xray  And lab results.  Brush /rinse and gargle after use  May use Armour/hammer baking soda/peroxide toothpaste  Mucinex DM Twice daily  As needed  Cough/congestion   Follow med calendar closely and bring to each visit      08/28/2013 f/u ov/Wert re: worse cough x 2 months/ still smoking / using med calendar / on 20 mg prednisone Chief Complaint  Patient presents with  . Follow-up    Pt still SOB, prod cough with green mucous.     coughing greeen since Xmas esp in am, some better p saba hfa but insists on using the neb first. >>augmentin x 10 d   10/30/13 Follow up and Med Review  Patient returns for a followup and medication review. Reviewed all her medications organized them into a medication calendar. Patient education. Patient has not smoked, encouraged on continued smoking cessation. Last visit. Patient had a slow to resolve bronchitis/ sinusitis She was treated with Augmentin for 10 days The symptoms are much improved She denies any hemoptysis, chest pain, orthopnea, PND, or leg swelling.  >>med calendar    12/14/2013 Acute OV sick since first week in May  Pt complains of increased SOB, wheezing, hoarseness, prod cough with green/yellow mucus for 6 weeks.  Waxing and waning  With congestion.  No fever for last few weeks.  Saw  PCP at onset and has finished 2 rounds augmentin.  fever at onset.  Last abx finished 3 days ago.  Wheezing is worse this last week.  No hemoptysis, chest pain , abd pain, n/v/d.  Husband getting surgery this week.  Mucinex DM is not helping.  Still smoking  rec Chest xray today  Prednisone taper over next week.  Mucinex DM Twice daily  As needed  Cough/congestion      02/05/2014 Follow up and Med Calendar  Patient returns for a 6 week followup and medication review. We reviewed all her medications organized them into a medication calendar with patient education. Appears that she is taking her medications correctly. Patient denies any hemoptysis, orthopnea, PND, or leg swelling. Says she still does not feel well , continues to cough up congestion w/ thick green mucus.  Complains of increased SOB, wheezing,  prod cough with green mucus, tightness since last ov.  Complains of left ear fullness . Was started on Prednisone last visit but has not been able to taper off, remains on Prednisone 20mg  daily  rec Levaquin 500mg  daily for 10 days -take with food Eat yogurt , take probiotic while on antibiotics Prednisone taper today -use my taper -if not improved go back to med calendar prednisone instructions.  Use Zyrtec 10mg  At bedtime  For 5 days then As needed  Drainage.  Please contact office for sooner follow up if symptoms do not improve or worsen or seek emergency care    02/16/2014 f/u ov/Wert re: copd/ not back to baseline since early May 2015  Chief Complaint  Patient presents with  . Acute Visit    Pt c/o ear pain and prod cough with green sputum- symptoms are no better since last visit with TP on 02/05/14. She is using albuterol in her neb every 4 hours.   does have med calendar and appears to correlate with meds Denies smoking and husband confirms Behavior in office erratic C/o sob x 10 ft, not reproducible C/o constant cough with green mucus, none during interview exam or walk rec Stop tudorza Start spiriva respimat 2 pffs each am Please see patient coordinator before you leave today  to schedule sinus ct See calendar for specific medication instructions   Admit 8/18 = 02/27/14 for aecopd with mild hypercarbia and CTa neg x emphysema   03/17/2014 post hosp f/u/ re-establish  ov/Wert re: copd/f/u denies smoking  Chief Complaint  Patient presents with  . HFU    Pt states recently d/c'ed from [facility] for URI. She states that since she was discharged, her breathing is no better. She is using albuterol inhaler 3-4 times per day and albuterol neb approx 6 times per day.  feels like losing ground again since d/c from [place] and treated as aecopd [place] hx will not answer specific questions "I know my body"  ? When were you last able to walk comfortably A last summer (2014)  able to walk at mall comfortably s 02.  Cough greenish tint / no better on abx  symbicort empty/ no mucinex  04/19/2014 Follow up and Medication calendar -COPD  Returns for follow up and medication review  We reviewed all her meds and organized them into a medication calendar w/ pt education.  Husband is with her today, she is very tearful and anxious throughout the exam. Very upset that her COPD has gotten this bad . Support provided with pt education .  Does not have symbicort with her today. Says it is at pharmacy their was a mix up with pharmacist.  Caprice Renshaw inhaler is set on 50 doses, this was set on 50 at last ov.  Assures me she is taking and has not missed any doses.  Says she can afford they are only $3 each.  Says she has not smoked and agrees to urine nicotine check today as requested by Dr. Melvyn Novas  . We discussed the importance of smoking cessation.  She denies hemoptysis , chest pain, orthopnea or edema.  Discussed pulmonary rehab and she agrees to referral .   Current Medications, Allergies, Complete Past Medical History, Past Surgical History, Family History, and Social History were reviewed in [employer] record.  ROS  The following are not active complaints unless bolded sore throat, dysphagia, dental problems, itching, sneezing,   , ear ache,   fever, chills, sweats, unintended wt loss, pleuritic or exertional cp, hemoptysis,  orthopnea pnd or leg swelling, presyncope, palpitations, heartburn, abdominal pain, anorexia, nausea, vomiting, diarrhea  or change in bowel or urinary habits, change in stools or urine, dysuria,hematuria,  rash, arthralgias, visual complaints, headache, numbness weakness or ataxia or problems with walking or coordination,  change in mood/affect or memory.         Objective:   Physical Exam   08/23/2011  Wt 145 >   149 01/24/2012 >  144 03/11/2012 > 04/23/2012  142 > 03/16/2013  130 >04/07/2013 > 132 04/24/13 > 134 05/08/2013 >134  06/29/2013 > 07/22/2013 130 > 08/28/2013 135 >138 10/30/13 >136 12/14/2013 >  12/25/2013 133 >02/05/2014 130 > 02/16/2014   133 > 9?9/15  128 >130 04/19/2014     NAD    HEENT mild turbinate edema.  Oropharynx clear , very poor dentition, cushingnoid appearance     NECK:  Supple w/ fair ROM; no JVD; normal carotid impulses w/o bruits; no thyromegaly or nodules palpated; no lymphadenopathy.  RESP  No wheezing noted no accessory muscle use, no dullness to percussion  CARD:  RRR, no m/r/g  , no peripheral edema, pulses intact, no cyanosis or clubbing.  GI:   Soft & nt; nml bowel sounds; no organomegaly or masses detected.  Musco: Warm bil, no deformities or joint swelling noted.   Neuro: alert, no focal deficits noted,   Skin: Warm, no lesions or rashes   CTa 02/24/14 emphysema only         Assessment & Plan:

## 2014-04-19 NOTE — Assessment & Plan Note (Signed)
Compensated w/ tendency toward flare  Anxiety and depression seem to be playing big role advised to follow up with PCP to discuss rx for symptoms.   Plan  Flu vaccine today  Urine check today for nicotine.  Referral to pulmonary rehab.  Follow med calendar closely and bring to each visit  Follow up Dr. Melvyn Novas  In  2 months and as needed.  Please contact office for sooner follow up if symptoms do not improve or worsen or seek emergency care

## 2014-04-19 NOTE — Patient Instructions (Signed)
Flu vaccine today  Urine check today for nicotine.  Referral to pulmonary rehab.  Follow med calendar closely and bring to each visit  Follow up Dr. Melvyn Novas  In  2 months and as needed.  Please contact office for sooner follow up if symptoms do not improve or worsen or seek emergency care

## 2014-04-24 ENCOUNTER — Emergency Department (HOSPITAL_COMMUNITY): Payer: Medicaid Other

## 2014-04-24 ENCOUNTER — Inpatient Hospital Stay (HOSPITAL_COMMUNITY)
Admission: EM | Admit: 2014-04-24 | Discharge: 2014-04-27 | DRG: 190 | Disposition: A | Discharge status: Home / Self Care | Payer: Medicaid Other | Attending: Internal Medicine | Admitting: Internal Medicine

## 2014-04-24 ENCOUNTER — Encounter (HOSPITAL_COMMUNITY): Payer: Self-pay | Admitting: Emergency Medicine

## 2014-04-24 DIAGNOSIS — J441 Chronic obstructive pulmonary disease with (acute) exacerbation: Secondary | ICD-10-CM | POA: Diagnosis not present

## 2014-04-24 DIAGNOSIS — J9622 Acute and chronic respiratory failure with hypercapnia: Secondary | ICD-10-CM | POA: Diagnosis present

## 2014-04-24 DIAGNOSIS — G9340 Encephalopathy, unspecified: Secondary | ICD-10-CM | POA: Diagnosis present

## 2014-04-24 DIAGNOSIS — F17210 Nicotine dependence, cigarettes, uncomplicated: Secondary | ICD-10-CM | POA: Diagnosis present

## 2014-04-24 DIAGNOSIS — I10 Essential (primary) hypertension: Secondary | ICD-10-CM | POA: Diagnosis present

## 2014-04-24 DIAGNOSIS — K219 Gastro-esophageal reflux disease without esophagitis: Secondary | ICD-10-CM | POA: Diagnosis present

## 2014-04-24 DIAGNOSIS — R0602 Shortness of breath: Secondary | ICD-10-CM | POA: Diagnosis not present

## 2014-04-24 DIAGNOSIS — R0600 Dyspnea, unspecified: Secondary | ICD-10-CM

## 2014-04-24 DIAGNOSIS — J9620 Acute and chronic respiratory failure, unspecified whether with hypoxia or hypercapnia: Secondary | ICD-10-CM

## 2014-04-24 DIAGNOSIS — J9612 Chronic respiratory failure with hypercapnia: Secondary | ICD-10-CM | POA: Diagnosis present

## 2014-04-24 DIAGNOSIS — G930 Cerebral cysts: Secondary | ICD-10-CM | POA: Diagnosis present

## 2014-04-24 DIAGNOSIS — Z9981 Dependence on supplemental oxygen: Secondary | ICD-10-CM | POA: Diagnosis not present

## 2014-04-24 DIAGNOSIS — Z720 Tobacco use: Secondary | ICD-10-CM | POA: Diagnosis present

## 2014-04-24 HISTORY — DX: Essential (primary) hypertension: I10

## 2014-04-24 LAB — I-STAT ARTERIAL BLOOD GAS, ED
Acid-Base Excess: 16 mmol/L — ABNORMAL HIGH (ref 0.0–2.0)
Bicarbonate: 44.3 mEq/L — ABNORMAL HIGH (ref 20.0–24.0)
O2 Saturation: 97 %
PO2 ART: 98 mmHg (ref 80.0–100.0)
TCO2: 46 mmol/L (ref 0–100)
pCO2 arterial: 69.9 mmHg (ref 35.0–45.0)
pH, Arterial: 7.41 (ref 7.350–7.450)

## 2014-04-24 LAB — COMPREHENSIVE METABOLIC PANEL
ALT: 26 U/L (ref 0–35)
ANION GAP: 8 (ref 5–15)
AST: 33 U/L (ref 0–37)
Albumin: 3.8 g/dL (ref 3.5–5.2)
Alkaline Phosphatase: 70 U/L (ref 39–117)
BUN: 8 mg/dL (ref 6–23)
CO2: 40 mEq/L (ref 19–32)
Calcium: 9.5 mg/dL (ref 8.4–10.5)
Chloride: 90 mEq/L — ABNORMAL LOW (ref 96–112)
Creatinine, Ser: 0.6 mg/dL (ref 0.50–1.10)
GFR calc Af Amer: >90 mL/min (ref >90)
GFR calc non Af Amer: >90 mL/min (ref >90)
Glucose, Bld: 137 mg/dL — ABNORMAL HIGH (ref 70–99)
Potassium: 4.2 mEq/L (ref 3.7–5.3)
Sodium: 138 mEq/L (ref 137–147)
Total Bilirubin: 0.5 mg/dL (ref 0.3–1.2)
Total Protein: 6.8 g/dL (ref 6.0–8.3)

## 2014-04-24 LAB — CBC
HCT: 44.5 % (ref 36.0–46.0)
Hemoglobin: 14.2 g/dL (ref 12.0–15.0)
MCH: 34.5 pg — AB (ref 26.0–34.0)
MCHC: 31.9 g/dL (ref 30.0–36.0)
MCV: 108.3 fL — ABNORMAL HIGH (ref 78.0–100.0)
PLATELETS: 165 10*3/uL (ref 150–400)
RBC: 4.11 MIL/uL (ref 3.87–5.11)
RDW: 14 % (ref 11.5–15.5)
WBC: 10.9 10*3/uL — ABNORMAL HIGH (ref 4.0–10.5)

## 2014-04-24 LAB — PRO B NATRIURETIC PEPTIDE: Pro B Natriuretic peptide (BNP): 516.2 pg/mL — ABNORMAL HIGH (ref 0–125)

## 2014-04-24 LAB — TROPONIN I

## 2014-04-24 MED ORDER — IPRATROPIUM BROMIDE 0.02 % IN SOLN
0.5000 mg | Freq: Once | RESPIRATORY_TRACT | Status: AC
  Administered 2014-04-24: 0.5 mg via RESPIRATORY_TRACT

## 2014-04-24 MED ORDER — DM-GUAIFENESIN ER 30-600 MG PO TB12
1.0000 | ORAL_TABLET | Freq: Two times a day (BID) | ORAL | Status: DC | PRN
  Filled 2014-04-24: qty 1

## 2014-04-24 MED ORDER — METHYLPREDNISOLONE SODIUM SUCC 125 MG IJ SOLR
80.0000 mg | Freq: Every day | INTRAMUSCULAR | Status: DC
  Administered 2014-04-25 (×2): 80 mg via INTRAVENOUS
  Filled 2014-04-24 (×2): qty 1.28

## 2014-04-24 MED ORDER — ALBUTEROL SULFATE (2.5 MG/3ML) 0.083% IN NEBU
5.0000 mg | INHALATION_SOLUTION | Freq: Once | RESPIRATORY_TRACT | Status: AC
  Administered 2014-04-24: 5 mg via RESPIRATORY_TRACT
  Filled 2014-04-24: qty 6

## 2014-04-24 MED ORDER — SODIUM CHLORIDE 0.9 % IV SOLN
INTRAVENOUS | Status: DC
  Administered 2014-04-24: 19:00:00 via INTRAVENOUS
  Administered 2014-04-25: 10 mL/h via INTRAVENOUS

## 2014-04-24 MED ORDER — IPRATROPIUM-ALBUTEROL 0.5-2.5 (3) MG/3ML IN SOLN: 3.0000 mL | RESPIRATORY_TRACT | Status: DC | PRN

## 2014-04-24 MED ORDER — LEVOFLOXACIN IN D5W 750 MG/150ML IV SOLN
750.0000 mg | INTRAVENOUS | Status: DC
  Administered 2014-04-24: 750 mg via INTRAVENOUS
  Filled 2014-04-24 (×2): qty 150

## 2014-04-24 MED ORDER — IPRATROPIUM BROMIDE 0.02 % IN SOLN
0.5000 mg | Freq: Once | RESPIRATORY_TRACT | Status: AC
  Administered 2014-04-24: 0.5 mg via RESPIRATORY_TRACT
  Filled 2014-04-24: qty 2.5

## 2014-04-24 MED ORDER — LORATADINE 10 MG PO TABS
10.0000 mg | ORAL_TABLET | Freq: Every day | ORAL | Status: DC
  Administered 2014-04-25 – 2014-04-27 (×3): 10 mg via ORAL
  Filled 2014-04-24 (×5): qty 1

## 2014-04-24 MED ORDER — ALBUTEROL (5 MG/ML) CONTINUOUS INHALATION SOLN
10.0000 mg/h | INHALATION_SOLUTION | RESPIRATORY_TRACT | Status: DC
  Administered 2014-04-24: 10 mg/h via RESPIRATORY_TRACT

## 2014-04-24 MED ORDER — HEPARIN SODIUM (PORCINE) 5000 UNIT/ML IJ SOLN
5000.0000 [IU] | Freq: Three times a day (TID) | INTRAMUSCULAR | Status: DC
  Administered 2014-04-24 – 2014-04-27 (×9): 5000 [IU] via SUBCUTANEOUS
  Filled 2014-04-24 (×14): qty 1

## 2014-04-24 MED ORDER — ALPRAZOLAM 0.5 MG PO TABS
0.5000 mg | ORAL_TABLET | Freq: Two times a day (BID) | ORAL | Status: DC
  Administered 2014-04-24 – 2014-04-27 (×7): 0.5 mg via ORAL
  Filled 2014-04-24 (×6): qty 1
  Filled 2014-04-24: qty 2
  Filled 2014-04-24: qty 1

## 2014-04-24 MED ORDER — BUDESONIDE-FORMOTEROL FUMARATE 160-4.5 MCG/ACT IN AERO
2.0000 | INHALATION_SPRAY | Freq: Two times a day (BID) | RESPIRATORY_TRACT | Status: DC
  Administered 2014-04-25 – 2014-04-27 (×5): 2 via RESPIRATORY_TRACT
  Filled 2014-04-24: qty 6

## 2014-04-24 MED ORDER — HYDROMORPHONE HCL 1 MG/ML IJ SOLN: 1.0000 mg | Freq: Once | INTRAMUSCULAR | Status: DC

## 2014-04-24 MED ORDER — FLUCONAZOLE 200 MG PO TABS
200.0000 mg | ORAL_TABLET | Freq: Every day | ORAL | Status: DC
  Filled 2014-04-24 (×4): qty 1

## 2014-04-24 MED ORDER — IPRATROPIUM BROMIDE 0.02 % IN SOLN
0.5000 mg | Freq: Once | RESPIRATORY_TRACT | Status: DC
  Filled 2014-04-24: qty 2.5

## 2014-04-24 MED ORDER — ALBUTEROL (5 MG/ML) CONTINUOUS INHALATION SOLN
10.0000 mg/h | INHALATION_SOLUTION | RESPIRATORY_TRACT | Status: DC
  Filled 2014-04-24: qty 20

## 2014-04-24 MED ORDER — ACETAMINOPHEN 500 MG PO TABS
500.0000 mg | ORAL_TABLET | Freq: Four times a day (QID) | ORAL | Status: DC | PRN
  Administered 2014-04-27: 500 mg via ORAL
  Filled 2014-04-24: qty 1

## 2014-04-24 MED ORDER — PREDNISONE 10 MG PO TABS
10.0000 mg | ORAL_TABLET | Freq: Two times a day (BID) | ORAL | Status: DC
  Filled 2014-04-24: qty 1

## 2014-04-24 MED ORDER — HYDROMORPHONE HCL 1 MG/ML IJ SOLN
0.5000 mg | Freq: Once | INTRAMUSCULAR | Status: DC
  Filled 2014-04-24: qty 1

## 2014-04-24 NOTE — ED Notes (Signed)
Dr. Reather Converse at bedside with admitting MD's. Decide to place pt on bipap and give continuous neb treatment. Pt able to state her name, "I  Hear you. I'm just tired". This is most alert pt has been thus far.

## 2014-04-24 NOTE — ED Notes (Signed)
Pt is sleeping at current, remains lethargic. Denies pain. Will hold pain med for now. Family at bedside.

## 2014-04-24 NOTE — ED Notes (Signed)
Patient transported to CT 

## 2014-04-24 NOTE — ED Notes (Signed)
Per EMS- Pt comes from home; respiratory distress since last night; is usually on 3 L oxygen; appears SOB and has been altered since last night; lung sounds diminished; yellow/green sputum. sats were 70 % initially. CBG 195, BP 118/72.

## 2014-04-24 NOTE — ED Provider Notes (Addendum)
CSN: [id number]     Arrival date & time 04/24/14  1235 History   First MD Initiated Contact with Patient 04/24/14 1237     Chief Complaint  Patient presents with  . Shortness of Breath     (Consider location/radiation/quality/duration/timing/severity/associated sxs/prior Treatment) Patient is a 43 y.o. female presenting with shortness of breath. The history is provided by the patient and the EMS personnel.  Shortness of Breath Associated symptoms: cough and wheezing   Associated symptoms: no abdominal pain, no chest pain, no fever, no headaches, no neck pain, no rash, no sore throat and no vomiting   pt with hx copd, home o2 3 liters, c/o severe sob for the past 1-2 days. Uses home neb and mdi with only mild relief. +smoker. States chest has also felt tight/diffusely for the past 1-2 days. No discrete/episodic chest pain. No associated nv or diaphoresis. Denies leg pain or swelling. No orthopnea or pnd. States compliant w normal meds. Denies prior intubation or icu stay. Non prod cough. No sore throat or runny nose. No fever or chills.      Past Medical History  Diagnosis Date  . Arachnoid cyst   . Tumor, thyroid   . COPD (chronic obstructive pulmonary disease)   . GERD (gastroesophageal reflux disease)   . Hypertension    Past Surgical History  Procedure Laterality Date  . Back surgery  2006  . Tubal ligation  2001   Family History  Problem Relation Age of Onset  . Emphysema Maternal Grandfather     was a smoker  . Allergies Son   . Allergies Daughter   . Asthma Son     as a young child  . Asthma Maternal Grandfather   . Asthma Maternal Aunt   . Heart disease Mother   . Heart disease Mother   . Heart disease Paternal Grandmother   . Uterine cancer Mother   . Colon cancer Maternal Grandmother   . Breast cancer Paternal Grandmother    History  Substance Use Topics  . Smoking status: Former Smoker -- 0.25 packs/day for 25 years    Types: Cigarettes    Quit date:  08/21/2013  . Smokeless tobacco: Never Used     Comment: Pt has not smoked in the past week due to her feeling ill 08/28/13  . Alcohol Use: No   OB History   Grav Para Term Preterm Abortions TAB SAB Ect Mult Living                 Review of Systems  Constitutional: Negative for fever and chills.  HENT: Negative for sore throat.   Eyes: Negative for discharge and redness.  Respiratory: Positive for cough, shortness of breath and wheezing.   Cardiovascular: Negative for chest pain and leg swelling.  Gastrointestinal: Negative for vomiting, abdominal pain and diarrhea.  Endocrine: Negative for polyuria.  Genitourinary: Negative for flank pain.  Musculoskeletal: Negative for back pain and neck pain.  Skin: Negative for rash.  Neurological: Negative for headaches.  Hematological: Does not bruise/bleed easily.  Psychiatric/Behavioral: Negative for confusion.      Allergies  Cymbalta; Pregabalin; and Wellbutrin  Home Medications   Prior to Admission medications   Medication Sig Start Date End Date Taking? Authorizing Provider  Aclidinium Bromide (TUDORZA PRESSAIR) 400 MCG/ACT AEPB Inhale 2 puffs into the lungs daily.    Historical Provider, MD  albuterol (PROAIR HFA) 108 (90 BASE) MCG/ACT inhaler Inhale 2 puffs into the lungs every 4 (four) hours as needed  for wheezing or shortness of breath ((PLAN B)).     Historical Provider, MD  albuterol (PROVENTIL) (2.5 MG/3ML) 0.083% nebulizer solution Take 3 mLs (2.5 mg total) by nebulization every 4 (four) hours as needed. DX 496 (PLAN C) 03/20/13 07/06/14  Tanda Rockers, MD  ALPRAZolam Duanne Moron) 0.5 MG tablet One every 6 hours if anxious related to breathing 03/30/14   Tanda Rockers, MD  amitriptyline (ELAVIL) 50 MG tablet Take 50 mg by mouth at bedtime.    Historical Provider, MD  cetirizine (ZYRTEC) 10 MG tablet Take 10 mg by mouth at bedtime as needed for allergies.    Historical Provider, MD  dexlansoprazole (DEXILANT) 60 MG capsule Take  30-60 min before first meal of the day 03/16/13   Tanda Rockers, MD  dextromethorphan-guaiFENesin [facility] DM) 30-600 MG per 12 hr tablet Take 1-2 tablets by mouth every 12 (twelve) hours as needed (w/ flutter).     Historical Provider, MD  famotidine (PEPCID) 20 MG tablet One at bedtime 03/16/13   Tanda Rockers, MD  fentaNYL (DURAGESIC - DOSED MCG/HR) 100 MCG/HR Place 1 patch onto the skin every other day.    Historical Provider, MD  furosemide (LASIX) 80 MG tablet Take 80 mg by mouth daily as needed.    Historical Provider, MD  Misc. Devices (ACAPELLA) MISC Use as directed 04/24/13   Tanda Rockers, MD  morphine (MSIR) 30 MG tablet Take 30 mg by mouth 4 (four) times daily. May add 1 every 4 hours as needed for severe pain    Historical Provider, MD  predniSONE (DELTASONE) 10 MG tablet 2 daily until better, then 1 daily x 3 days  then stop 02/16/14   Tanda Rockers, MD  SYMBICORT 160-4.5 MCG/ACT inhaler INHALE 2 PUFFS FIRST THING IN THE MORNING AND THEN 2 PUFFS ABOUT 12 HOURS LATER (PLAN A) 04/19/14   Tanda Rockers, MD  Tiotropium Bromide Monohydrate (SPIRIVA RESPIMAT) 2.5 MCG/ACT AERS 2 puffs each am 02/16/14   Tanda Rockers, MD   BP 138/70  Pulse 93  Resp 23  SpO2 100% Physical Exam  Nursing note and vitals reviewed. Constitutional: She appears well-developed and well-nourished.  +resp distress, poor air exchange.   HENT:  Mouth/Throat: Oropharynx is clear and moist.  Eyes: Conjunctivae are normal. No scleral icterus.  Neck: Neck supple. No JVD present. No tracheal deviation present.  Cardiovascular: Normal rate, regular rhythm, normal heart sounds and intact distal pulses.   Pulmonary/Chest: She is in respiratory distress. She has wheezes.  Markedly diminished air exchange bil   Abdominal: Soft. Normal appearance and bowel sounds are normal. She exhibits no distension and no mass. There is no tenderness. There is no rebound and no guarding.  Genitourinary:  No cva tenderness   Musculoskeletal: She exhibits no edema and no tenderness.  Neurological: She is alert.  Skin: Skin is warm and dry. No rash noted.  Psychiatric: She has a normal mood and affect.    ED Course  Procedures (including critical care time) Labs Review   Results for orders placed during the hospital encounter of 04/24/14  CBC      Result Value Ref Range   WBC 10.9 (*) 4.0 - 10.5 K/uL   RBC 4.11  3.87 - 5.11 MIL/uL   Hemoglobin 14.2  12.0 - 15.0 g/dL   HCT 44.5  36.0 - 46.0 %   MCV 108.3 (*) 78.0 - 100.0 fL   MCH 34.5 (*) 26.0 - 34.0 pg  MCHC 31.9  30.0 - 36.0 g/dL   RDW 14.0  11.5 - 15.5 %   Platelets 165  150 - 400 K/uL  COMPREHENSIVE METABOLIC PANEL      Result Value Ref Range   Sodium 138  137 - 147 mEq/L   Potassium 4.2  3.7 - 5.3 mEq/L   Chloride 90 (*) 96 - 112 mEq/L   CO2 40 (*) 19 - 32 mEq/L   Glucose, Bld 137 (*) 70 - 99 mg/dL   BUN 8  6 - 23 mg/dL   Creatinine, Ser 0.60  0.50 - 1.10 mg/dL   Calcium 9.5  8.4 - 10.5 mg/dL   Total Protein 6.8  6.0 - 8.3 g/dL   Albumin 3.8  3.5 - 5.2 g/dL   AST 33  0 - 37 U/L   ALT 26  0 - 35 U/L   Alkaline Phosphatase 70  39 - 117 U/L   Total Bilirubin 0.5  0.3 - 1.2 mg/dL   GFR calc non Af Amer >90  >90 mL/min   GFR calc Af Amer >90  >90 mL/min   Anion gap 8  5 - 15  TROPONIN I      Result Value Ref Range   Troponin I <0.30  <0.30 ng/mL  PRO B NATRIURETIC PEPTIDE      Result Value Ref Range   Pro B Natriuretic peptide (BNP) 516.2 (*) 0 - 125 pg/mL  I-STAT ARTERIAL BLOOD GAS, ED      Result Value Ref Range   pH, Arterial 7.410  7.350 - 7.450   pCO2 arterial 69.9 (*) 35.0 - 45.0 mmHg   pO2, Arterial 98.0  80.0 - 100.0 mmHg   Bicarbonate 44.3 (*) 20.0 - 24.0 mEq/L   TCO2 46  0 - 100 mmol/L   O2 Saturation 97.0     Acid-Base Excess 16.0 (*) 0.0 - 2.0 mmol/L   Collection site RADIAL, ALLEN'S TEST ACCEPTABLE     Drawn by RT     Sample type ARTERIAL     Comment NOTIFIED PHYSICIAN     Ct Head Wo Contrast  04/24/2014    CLINICAL DATA:  43 year old female with altered mental status. Headache. Hypertension. Arachnoid cyst. Initial encounter.  EXAM: CT HEAD WITHOUT CONTRAST  TECHNIQUE: Contiguous axial images were obtained from the base of the skull through the vertex without intravenous contrast.  COMPARISON:  01/04/2010 CT.  11/12/2004 MR.  FINDINGS: Examination is slightly motion degraded.  No intracranial hemorrhage.  Nonspecific white matter type changes without CT evidence of large acute infarct. If infarct is of high clinical concern (particularly posterior fossa infarct) MR may be considered.  No hydrocephalus.  No intracranial mass lesion noted on this unenhanced exam.  Cerebellar tonsils slightly low lying within the range normal limits.  Mild exophthalmos.  Mastoid air cells, middle ear cavities and visualized paranasal sinuses are clear.  IMPRESSION: Examination is slightly motion degraded.  No intracranial hemorrhage.  Nonspecific white matter type changes without CT evidence of large acute infarct. If infarct is of high clinical concern (particularly posterior fossa infarct) MR may be considered. This would also help further evaluate nonspecific white matter type changes.   Electronically Signed   By: Chauncey Cruel M.D.   On: 04/24/2014 16:01   Dg Chest Port 1 View  04/24/2014   CLINICAL DATA:  Mid chest pain. Severe shortness of breath. Ex-smoker.  EXAM: PORTABLE CHEST - 1 VIEW  COMPARISON:  02/24/2014 and chest CTA dated 02/24/2014.  FINDINGS:  Normal sized heart. Clear lungs. The lungs remain mildly hyperexpanded with mildly prominent interstitial markings. Unremarkable bones.  IMPRESSION: No acute abnormality.  Stable mild changes of COPD.   Electronically Signed   By: Enrique Sack M.D.   On: 04/24/2014 13:20          EKG Interpretation   Date/Time:  Saturday April 24 2014 12:42:24 EDT Ventricular Rate:  71 PR Interval:  107 QRS Duration: 111 QT Interval:  388 QTC Calculation: 422 R Axis:    76 Text Interpretation:  Sinus rhythm Short PR interval Nonspecific T wave  abnormality No significant change since last tracing Confirmed by Ashok Cordia   MD, Lennette Bihari ([id number]) on 04/24/2014 12:45:22 PM      MDM   Iv ns. Continuous pulse ox and monitor. o2 [place].  Labs. Pcxr. Ecg.  Reviewed nursing notes and prior charts for additional history.   Persistent wheezing. Continuous albuterol neb tx for 1 hr, atrovent added.  ems had already given neb txs and solumedrol pta.  Reviewed nursing notes and prior charts for additional history.   Wheezing persists, although air exchange slowly improved. Additional alb and atrovent nebs.   On abg, ph normal, although pco2 elev - ?chronic and likely c/w baseline.   Given c/o headache, altered mental status/drowsiness, and abg without apparent marked acute elev co2, will get ct.  CRITICAL CARE  RE acute on chronic respiratory failure, copd exacerbation, resp distress, altered mental status.  Performed by: Mirna Mires Total critical care time: 40 Critical care time was exclusive of separately billable procedures and treating other patients. Critical care was necessary to treat or prevent imminent or life-threatening deterioration. Critical care was time spent personally by me on the following activities: development of treatment plan with patient and/or surrogate as well as nursing, discussions with consultants, evaluation of patient's response to treatment, examination of patient, obtaining history from patient or surrogate, ordering and performing treatments and interventions, ordering and review of laboratory studies, ordering and review of radiographic studies, pulse oximetry and re-evaluation of patient's condition.  Requests pt w tsb - will admit       Mirna Mires, MD 04/24/14 [phone]

## 2014-04-24 NOTE — H&P (Signed)
Date: 04/24/2014               Patient Name:  [name] MRN: [id number]  DOB: 08-29-1970 Age / Sex: 43 y.o., female   PCP: Leonard Downing, MD         Medical Service: Internal Medicine Teaching Service         Attending Physician: Dr. Mirna Mires, MD    First Contact: Dr. Albin Felling Pager: [phone]  Second Contact: Dr. 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 Patient was unable to give a history due to altered mental status. Her husband and daughter were present to give a history. Family reports the patient has had severe SOB for the past 1-2 days and then this morning she was unresponsive. Family notes she also was complaining of a headache yesterday. Family notes she was able to verbalize to them yesterday, but she was not able to talk to them at all today. Family denies any history of depression, suicidal ideation, or narcotic abuse. Patient does take MS Contin at home. Family denies any empty pill bottles at home or misuse of medications. Family reports she was hospitalized at [facility] 1.5 months ago and was in the ICU for her COPD. She was put on oxygen at that time. She now uses 2-3 L of oxygen at home. Family notes she has had a cough productive of green sputum for the past few weeks. Family states she was started on Levaquin a few weeks ago and was just given another prescription to continue Levaquin. She follows with Dr. Melvyn Novas (pulmonology). Family reports she is compliant with her medications and is on Prednisone 10 mg BID.  In the ED, an ABG was done that showed pH 7.4, pCO2 70, pO2 98, bicarb 44, and O2 sat 97%. Her WBC count found  to be 10.9. CT head negative. CXR negative. Patient noted to have maculopapular rash on her abdomen and back. Family reports the rash was not present yesterday.    Meds: Current Facility-Administered Medications  Medication Dose Route Frequency Provider Last Rate Last Dose  . 0.9 %  sodium chloride infusion   Intravenous Continuous Mirna Mires, MD      . albuterol (PROVENTIL,VENTOLIN) solution continuous neb  10 mg/hr Nebulization Continuous Mirna Mires, MD      . HYDROmorphone (DILAUDID) injection 0.5 mg  0.5 mg Intravenous Once Mirna Mires, MD       Current Outpatient Prescriptions  Medication Sig Dispense Refill  . acetaminophen (TYLENOL) 500 MG tablet Take 500 mg by mouth every 6 (six) hours as needed for moderate pain.      . Aclidinium Bromide (TUDORZA PRESSAIR) 400 MCG/ACT AEPB Inhale 2 puffs into the lungs daily.      Marland Kitchen albuterol (PROAIR HFA) 108 (90 BASE) MCG/ACT inhaler Inhale 2 puffs into the lungs every 4 (four) hours as needed for wheezing or shortness of breath ((PLAN B)).       Marland Kitchen ALPRAZolam (XANAX) 0.5 MG tablet Take 0.5 mg by mouth 2 (two) times daily.      . budesonide (PULMICORT) 0.5 MG/2ML nebulizer solution  Take 0.5 mg by nebulization 2 (two) times daily.      . budesonide-formoterol (SYMBICORT) 160-4.5 MCG/ACT inhaler Inhale 2 puffs into the lungs 2 (two) times daily.      . cetirizine (ZYRTEC) 10 MG tablet Take 10 mg by mouth at bedtime as needed for allergies.      Marland Kitchen dexlansoprazole (DEXILANT) 60 MG capsule Take 60 mg by mouth daily.      Marland Kitchen dextromethorphan-guaiFENesin (MUCINEX DM) 30-600 MG per 12 hr tablet Take 1-2 tablets by mouth every 12 (twelve) hours as needed (w/ flutter).       . famotidine (PEPCID) 20 MG tablet Take 20 mg by mouth daily.      . fluconazole (DIFLUCAN) 200 MG tablet Take 200 mg by mouth daily. For 7 days      . furosemide (LASIX) 80 MG tablet Take 40-80 mg by mouth daily as needed for fluid.       Marland Kitchen ipratropium-albuterol (DUONEB) 0.5-2.5  (3) MG/3ML SOLN Take 3 mLs by nebulization 4 (four) times daily.      Marland Kitchen morphine (MSIR) 30 MG tablet Take 30 mg by mouth 4 (four) times daily. May add 1 every 4 hours as needed for severe pain      . predniSONE (DELTASONE) 10 MG tablet Take 10 mg by mouth 2 (two) times daily.      Marland Kitchen tiotropium (SPIRIVA) 18 MCG inhalation capsule Place 36 mcg into inhaler and inhale daily.      . Vitamin D, Ergocalciferol, (DRISDOL) 50000 UNITS CAPS capsule Take 50,000 Units by mouth every 7 (seven) days.        Allergies: Allergies as of 04/24/2014 - Review Complete 04/24/2014  Allergen Reaction Noted  . Cymbalta [duloxetine hcl] Nausea And Vomiting 08/23/2011  . Pregabalin Swelling 08/23/2011  . Wellbutrin [bupropion] Other (See Comments) 08/14/2013   Past Medical History  Diagnosis Date  . Arachnoid cyst   . Tumor, thyroid   . COPD (chronic obstructive pulmonary disease)   . GERD (gastroesophageal reflux disease)   . Hypertension    Past Surgical History  Procedure Laterality Date  . Back surgery  2006  . Tubal ligation  2001   Family History  Problem Relation Age of Onset  . Emphysema Maternal Grandfather     was a smoker  . Allergies Son   . Allergies Daughter   . Asthma Son     as a young child  . Asthma Maternal Grandfather   . Asthma Maternal Aunt   . Heart disease Mother   . Heart disease Mother   . Heart disease Paternal Grandmother   . Uterine cancer Mother   . Colon cancer Maternal Grandmother   . Breast cancer Paternal Grandmother    History   Social History  . Marital Status: Married    Spouse Name: N/A    Number of Children: 3  . Years of Education: N/A   Occupational History  . Disabled    Social History Main Topics  . Smoking status: Former Smoker -- 0.25 packs/day for 25 years    Types: Cigarettes    Quit date: 08/21/2013  . Smokeless tobacco: Never Used     Comment: Pt has not smoked in the past week due to her feeling ill 08/28/13  . Alcohol Use: No  .  Drug Use: No  . Sexual Activity: Not on file   Other Topics Concern  . Not on file   Social History Narrative  . No narrative on file  Review of Systems: General: Denies fever, chills, night sweats, changes in weight, changes in appetite HEENT: Denies ear pain, changes in vision, rhinorrhea, sore throat CV: Denies CP, palpitations, SOB, orthopnea Pulm: See HPI GI: Denies abdominal pain, nausea, vomiting, diarrhea, constipation, melena, hematochezia GU: Denies dysuria, hematuria, frequency Msk: Denies muscle cramps, joint pains Neuro: Denies numbness, tingling Skin: See HPI  Physical Exam: Blood pressure 117/68, pulse 112, resp. rate 28, SpO2 94.00%. General: difficult to arouse, responds to sternal rub HEENT: [place]/AT, EOMI, PERRL, sclera anicteric, pharynx non-erythematous, mucus membranes moist Neck: supple, no JVD, no lymphadenopathy CV: tachycardic, normal S1/S2, no m/g/r Pulm: diffuse rhonchi and wheezing present bilaterally, breaths labored on 2L oxygen via [place] Abd: BS+, soft, obese, non-tender Ext: warm, no edema Neuro: difficult to arouse, responds to some commands, responds to sternal rub, only able to verbalize a few words before becoming unresponsive, meningeal signs negative Skin: maculopapular rash present on chest, abdomen, and back  Lab results: Basic Metabolic Panel:  Recent Labs  04/24/14 1248  NA 138  K 4.2  CL 90*  CO2 40*  GLUCOSE 137*  BUN 8  CREATININE 0.60  CALCIUM 9.5   Liver Function Tests:  Recent Labs  04/24/14 1248  AST 33  ALT 26  ALKPHOS 70  BILITOT 0.5  PROT 6.8  ALBUMIN 3.8   No results found for this basename: LIPASE, AMYLASE,  in the last 72 hours No results found for this basename: AMMONIA,  in the last 72 hours CBC:  Recent Labs  04/24/14 1248  WBC 10.9*  HGB 14.2  HCT 44.5  MCV 108.3*  PLT 165   Cardiac Enzymes:  Recent Labs  04/24/14 1248  TROPONINI <0.30   BNP:  Recent Labs  04/24/14 1248  PROBNP  516.2*   D-Dimer: No results found for this basename: DDIMER,  in the last 72 hours CBG: No results found for this basename: GLUCAP,  in the last 72 hours Hemoglobin A1C: No results found for this basename: HGBA1C,  in the last 72 hours Fasting Lipid Panel: No results found for this basename: CHOL, HDL, LDLCALC, TRIG, CHOLHDL, LDLDIRECT,  in the last 72 hours Thyroid Function Tests: No results found for this basename: TSH, T4TOTAL, FREET4, T3FREE, THYROIDAB,  in the last 72 hours Anemia Panel: No results found for this basename: VITAMINB12, FOLATE, FERRITIN, TIBC, IRON, RETICCTPCT,  in the last 72 hours Coagulation: No results found for this basename: LABPROT, INR,  in the last 72 hours Urine Drug Screen: Drugs of Abuse     Component Value Date/Time   LABOPIA POSITIVE* 01/04/2010 2017   [place] 01/04/2010 2017   LABBENZ POSITIVE* 01/04/2010 2017   AMPHETMU NONE DETECTED 01/04/2010 2017   THCU NONE DETECTED 01/04/2010 2017   LABBARB  Value: NONE DETECTED        DRUG SCREEN FOR MEDICAL PURPOSES ONLY.  IF CONFIRMATION IS NEEDED FOR ANY PURPOSE, NOTIFY LAB WITHIN 5 DAYS.        LOWEST DETECTABLE LIMITS FOR URINE DRUG SCREEN Drug Class       Cutoff (ng/mL) Amphetamine      1000 Barbiturate      200 Benzodiazepine   716 Tricyclics       967 Opiates          300 Cocaine          300 THC              50 01/04/2010 2017    Alcohol Level: No  results found for this basename: ETH,  in the last 72 hours Urinalysis: No results found for this basename: COLORURINE, APPERANCEUR, LABSPEC, PHURINE, GLUCOSEU, HGBUR, BILIRUBINUR, KETONESUR, PROTEINUR, UROBILINOGEN, NITRITE, LEUKOCYTESUR,  in the last 72 hours   Imaging results:  Ct Head Wo Contrast  04/24/2014   CLINICAL DATA:  43 year old female with altered mental status. Headache. Hypertension. Arachnoid cyst. Initial encounter.  EXAM: CT HEAD WITHOUT CONTRAST  TECHNIQUE: Contiguous axial images were obtained from the base of the skull  through the vertex without intravenous contrast.  COMPARISON:  01/04/2010 CT.  11/12/2004 MR.  FINDINGS: Examination is slightly motion degraded.  No intracranial hemorrhage.  Nonspecific white matter type changes without CT evidence of large acute infarct. If infarct is of high clinical concern (particularly posterior fossa infarct) MR may be considered.  No hydrocephalus.  No intracranial mass lesion noted on this unenhanced exam.  Cerebellar tonsils slightly low lying within the range normal limits.  Mild exophthalmos.  Mastoid air cells, middle ear cavities and visualized paranasal sinuses are clear.  IMPRESSION: Examination is slightly motion degraded.  No intracranial hemorrhage.  Nonspecific white matter type changes without CT evidence of large acute infarct. If infarct is of high clinical concern (particularly posterior fossa infarct) MR may be considered. This would also help further evaluate nonspecific white matter type changes.   Electronically Signed   By: Chauncey Cruel M.D.   On: 04/24/2014 16:01   Dg Chest Port 1 View  04/24/2014   CLINICAL DATA:  Mid chest pain. Severe shortness of breath. Ex-smoker.  EXAM: PORTABLE CHEST - 1 VIEW  COMPARISON:  02/24/2014 and chest CTA dated 02/24/2014.  FINDINGS: Normal sized heart. Clear lungs. The lungs remain mildly hyperexpanded with mildly prominent interstitial markings. Unremarkable bones.  IMPRESSION: No acute abnormality.  Stable mild changes of COPD.   Electronically Signed   By: Enrique Sack M.D.   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  1. Acute Respiratory Failure with Hypercapnia: Patient presented with 2 day history of severe SOB and was found to have AMS this morning  by her family. She had an ABG that showed pH 7.4, pCO2 70, pO2 98, bicarb 44, and O2 sat 97%. Initially there was concern for possible meningitis given AMS and new rash, but patient became more alert and did not have meningeal signs. Narcotic abuse/overdose was also a concern, but according to family she has no history of narcotic abuse or suicidal ideation. Her respiratory failure is likely due to her COPD and worsening airway disease.  - Continuous BiPAP - Albuterol nebs - Continue Symbicort - Continue Prednisone 10 mg BID - Mucinex - Pulse oximetry  2. COPD GOLD II/III: Patient follows with Dr. Melvyn Novas. Last PFTs in 2013. Patient takes [place] daily, Symbicort BID, Duonebs 4 times daily, Spiriva daily, and Albuterol PRN. Patient uses 2-3 L oxygen at home. Patient presented with SOB and AMS. Per family, patient has had severe SOB for past 1-2 days. She was recently hospitalized at [facility] in the ICU for her COPD. Patient still smoking per family. In the ED, patient finally became more alert and was able to speak with [place] and her family. Her breathing was still labored while on oxygen.  Will place patient on BiPAP. - Continuous BiPAP - Albuterol nebs - Continue Symbicort - Continue Prednisone 10 mg BID - Mucinex - Pulse oximetry  3. HTN: BP 138/70 on admission. Dropped to 90s-100s/60-70s. Patient takes Lasix 40-80 mg daily PRN edema.  - Lasix not necessary at this time given low pressures and no edema on exam  4. GERD: Patient takes Pepcid 20 mg daily at home. - Hold for now given respiratory status   Diet: NPO DVT/PE PPx: Heparin SQ Dispo: Disposition is deferred at this time, awaiting improvement of current medical problems. Anticipated discharge in approximately 2-3 day(s).   The patient does have a current PCP Redmond Pulling Arna Medici, MD) and does need an [facility] hospital follow-up appointment after discharge.  The patient does not have transportation limitations that hinder  transportation to clinic appointments.  Signed: Albin Felling, MD 04/24/2014, 5:04 PM

## 2014-04-24 NOTE — ED Notes (Signed)
Admitting MDs at bedside.

## 2014-04-24 NOTE — Progress Notes (Signed)
Pt transported to 3S on [facility], report called to [place], RRT.

## 2014-04-25 ENCOUNTER — Encounter (HOSPITAL_COMMUNITY): Payer: Self-pay | Admitting: *Deleted

## 2014-04-25 DIAGNOSIS — J441 Chronic obstructive pulmonary disease with (acute) exacerbation: Principal | ICD-10-CM

## 2014-04-25 DIAGNOSIS — J9622 Acute and chronic respiratory failure with hypercapnia: Secondary | ICD-10-CM

## 2014-04-25 DIAGNOSIS — G8929 Other chronic pain: Secondary | ICD-10-CM

## 2014-04-25 DIAGNOSIS — I10 Essential (primary) hypertension: Secondary | ICD-10-CM

## 2014-04-25 DIAGNOSIS — K219 Gastro-esophageal reflux disease without esophagitis: Secondary | ICD-10-CM

## 2014-04-25 LAB — COMPREHENSIVE METABOLIC PANEL
ALT: 19 U/L (ref 0–35)
AST: 26 U/L (ref 0–37)
Albumin: 3.2 g/dL — ABNORMAL LOW (ref 3.5–5.2)
Alkaline Phosphatase: 59 U/L (ref 39–117)
Anion gap: 10 (ref 5–15)
BUN: 14 mg/dL (ref 6–23)
CALCIUM: 9.5 mg/dL (ref 8.4–10.5)
CO2: 36 meq/L — AB (ref 19–32)
CREATININE: 0.49 mg/dL — AB (ref 0.50–1.10)
Chloride: 92 mEq/L — ABNORMAL LOW (ref 96–112)
GFR calc Af Amer: >90 mL/min (ref >90)
Glucose, Bld: 140 mg/dL — ABNORMAL HIGH (ref 70–99)
Potassium: 4.7 mEq/L (ref 3.7–5.3)
Sodium: 138 mEq/L (ref 137–147)
Total Bilirubin: 0.5 mg/dL (ref 0.3–1.2)
Total Protein: 6.1 g/dL (ref 6.0–8.3)

## 2014-04-25 LAB — CBC
HCT: 40.1 % (ref 36.0–46.0)
Hemoglobin: 13.2 g/dL (ref 12.0–15.0)
MCH: 34.4 pg — ABNORMAL HIGH (ref 26.0–34.0)
MCHC: 32.9 g/dL (ref 30.0–36.0)
MCV: 104.4 fL — AB (ref 78.0–100.0)
PLATELETS: 198 10*3/uL (ref 150–400)
RBC: 3.84 MIL/uL — AB (ref 3.87–5.11)
RDW: 13.4 % (ref 11.5–15.5)
WBC: 6.9 10*3/uL (ref 4.0–10.5)

## 2014-04-25 LAB — EXPECTORATED SPUTUM ASSESSMENT W REFEX TO RESP CULTURE

## 2014-04-25 LAB — MRSA PCR SCREENING: MRSA by PCR: NEGATIVE

## 2014-04-25 MED ORDER — MORPHINE SULFATE 4 MG/ML IJ SOLN
4.0000 mg | Freq: Once | INTRAMUSCULAR | Status: AC
  Administered 2014-04-25: 2 mg via INTRAVENOUS
  Filled 2014-04-25: qty 1

## 2014-04-25 MED ORDER — MORPHINE SULFATE 15 MG PO TABS
30.0000 mg | ORAL_TABLET | Freq: Four times a day (QID) | ORAL | Status: DC
  Administered 2014-04-25 – 2014-04-27 (×10): 30 mg via ORAL
  Filled 2014-04-25 (×11): qty 2

## 2014-04-25 MED ORDER — FLUCONAZOLE 200 MG PO TABS: 200.0000 mg | ORAL_TABLET | Freq: Every day | ORAL | Status: DC

## 2014-04-25 MED ORDER — LEVOFLOXACIN 750 MG PO TABS
750.0000 mg | ORAL_TABLET | Freq: Every day | ORAL | Status: DC
  Administered 2014-04-25 – 2014-04-26 (×2): 750 mg via ORAL
  Filled 2014-04-25 (×3): qty 1

## 2014-04-25 MED ORDER — FAMOTIDINE 20 MG PO TABS
20.0000 mg | ORAL_TABLET | Freq: Every day | ORAL | Status: DC
  Administered 2014-04-25 – 2014-04-27 (×3): 20 mg via ORAL
  Filled 2014-04-25 (×3): qty 1

## 2014-04-25 MED ORDER — FLUCONAZOLE 200 MG PO TABS
200.0000 mg | ORAL_TABLET | Freq: Every day | ORAL | Status: DC
  Administered 2014-04-25 – 2014-04-27 (×3): 200 mg via ORAL
  Filled 2014-04-25 (×3): qty 1

## 2014-04-25 MED ORDER — METHYLPREDNISOLONE SODIUM SUCC 125 MG IJ SOLR
60.0000 mg | Freq: Every day | INTRAMUSCULAR | Status: DC
  Administered 2014-04-26: 60 mg via INTRAVENOUS
  Filled 2014-04-25: qty 0.96

## 2014-04-25 NOTE — Progress Notes (Signed)
Subjective: Patient more alert and oriented this morning. She was placed on BiPAP overnight, but according to nurses she kept taking off the mask. She is breathing comfortably on 2 L oxygen via [place].     Objective: Vital signs in last 24 hours: Filed Vitals:   04/25/14 0700 04/25/14 0751 04/25/14 0952 04/25/14 1100  BP:  123/79    Pulse:  103    Temp: 97.9 F (36.6 C)   97.9 F (36.6 C)  TempSrc: Oral   Axillary  Resp:  28    Height:      Weight:      SpO2:  97% 93%    Weight change:   Intake/Output Summary (Last 24 hours) at 04/25/14 1321 Last data filed at 04/25/14 1200  Gross per 24 hour  Intake    370 ml  Output   2700 ml  Net  -2330 ml   Physical Exam General: alert, sitting up in bed, breathing comfortably HEENT: [place]/AT, EOMI, mucus membranes moist CV: tachycardic, normal S1/S2, no m/g/r Pulm: mild wheezes heard bilaterally, breathing comfortably on 2 L oxygen via [place] Abd: BS+, soft, non-distended, non-tender Ext: warm, no edema, moves all Neuro: alert and oriented x 3, CNs II-XII intact Skin: maculopapular rash improving on chest and abdomen  Lab Results: Basic Metabolic Panel:  Recent Labs Lab 04/24/14 1248 04/25/14 0320  NA 138 138  K 4.2 4.7  CL 90* 92*  CO2 40* 36*  GLUCOSE 137* 140*  BUN 8 14  CREATININE 0.60 0.49*  CALCIUM 9.5 9.5   Liver Function Tests:  Recent Labs Lab 04/24/14 1248 04/25/14 0320  AST 33 26  ALT 26 19  ALKPHOS 70 59  BILITOT 0.5 0.5  PROT 6.8 6.1  ALBUMIN 3.8 3.2*   No results found for this basename: LIPASE, AMYLASE,  in the last 168 hours No results found for this basename: AMMONIA,  in the last 168 hours CBC:  Recent Labs Lab 04/24/14 1248 04/25/14 0320  WBC 10.9* 6.9  HGB 14.2 13.2  HCT 44.5 40.1  MCV 108.3* 104.4*  PLT 165 198   Cardiac Enzymes:  Recent Labs Lab 04/24/14 1248  TROPONINI <0.30   BNP:  Recent Labs Lab 04/24/14 1248  PROBNP 516.2*   D-Dimer: No results found for this  basename: DDIMER,  in the last 168 hours CBG: No results found for this basename: GLUCAP,  in the last 168 hours Hemoglobin A1C: No results found for this basename: HGBA1C,  in the last 168 hours Fasting Lipid Panel: No results found for this basename: CHOL, HDL, LDLCALC, TRIG, CHOLHDL, LDLDIRECT,  in the last 168 hours Thyroid Function Tests: No results found for this basename: TSH, T4TOTAL, FREET4, T3FREE, THYROIDAB,  in the last 168 hours Coagulation: No results found for this basename: LABPROT, INR,  in the last 168 hours Anemia Panel: No results found for this basename: VITAMINB12, FOLATE, FERRITIN, TIBC, IRON, RETICCTPCT,  in the last 168 hours Urine Drug Screen: Drugs of Abuse     Component Value Date/Time   LABOPIA POSITIVE* 01/04/2010 2017   [place] 01/04/2010 2017   LABBENZ POSITIVE* 01/04/2010 2017   AMPHETMU NONE DETECTED 01/04/2010 2017   THCU NONE DETECTED 01/04/2010 2017   LABBARB  Value: NONE DETECTED        DRUG SCREEN FOR MEDICAL PURPOSES ONLY.  IF CONFIRMATION IS NEEDED FOR ANY PURPOSE, NOTIFY LAB WITHIN 5 DAYS.        LOWEST DETECTABLE LIMITS FOR URINE DRUG  SCREEN Drug Class       Cutoff (ng/mL) Amphetamine      1000 Barbiturate      200 Benzodiazepine   086 Tricyclics       578 Opiates          300 Cocaine          300 THC              50 01/04/2010 2017    Alcohol Level: No results found for this basename: ETH,  in the last 168 hours Urinalysis: No results found for this basename: COLORURINE, APPERANCEUR, LABSPEC, [place], GLUCOSEU, HGBUR, BILIRUBINUR, KETONESUR, PROTEINUR, UROBILINOGEN, NITRITE, LEUKOCYTESUR,  in the last 168 hours   Micro Results: Recent Results (from the past 240 hour(s))  CULTURE, EXPECTORATED SPUTUM-ASSESSMENT     Status: None   Collection Time    04/25/14  1:40 AM      Result Value Ref Range Status   Specimen Description SPUTUM   Final   Special Requests NONE   Final   Sputum evaluation     Final   Value: MICROSCOPIC  FINDINGS SUGGEST THAT THIS SPECIMEN IS NOT REPRESENTATIVE OF LOWER RESPIRATORY SECRETIONS. PLEASE RECOLLECT.     RESULT CALLED TO, READ BACK BY AND VERIFIED WITH: Milta Deiters RN [id number] GREEN R   Report Status 04/25/2014 FINAL   Final  MRSA PCR SCREENING     Status: None   Collection Time    04/25/14  1:52 AM      Result Value Ref Range Status   MRSA by PCR NEGATIVE  NEGATIVE Final   Comment:            The GeneXpert MRSA Assay (FDA     approved for NASAL specimens     only), is one component of a     comprehensive MRSA colonization     surveillance program. It is not     intended to diagnose MRSA     infection nor to guide or     monitor treatment for     MRSA infections.   Studies/Results: Ct Head Wo Contrast  04/24/2014   CLINICAL DATA:  43 year old female with altered mental status. Headache. Hypertension. Arachnoid cyst. Initial encounter.  EXAM: CT HEAD WITHOUT CONTRAST  TECHNIQUE: Contiguous axial images were obtained from the base of the skull through the vertex without intravenous contrast.  COMPARISON:  01/04/2010 CT.  11/12/2004 MR.  FINDINGS: Examination is slightly motion degraded.  No intracranial hemorrhage.  Nonspecific white matter type changes without CT evidence of large acute infarct. If infarct is of high clinical concern (particularly posterior fossa infarct) MR may be considered.  No hydrocephalus.  No intracranial mass lesion noted on this unenhanced exam.  Cerebellar tonsils slightly low lying within the range normal limits.  Mild exophthalmos.  Mastoid air cells, middle ear cavities and visualized paranasal sinuses are clear.  IMPRESSION: Examination is slightly motion degraded.  No intracranial hemorrhage.  Nonspecific white matter type changes without CT evidence of large acute infarct. If infarct is of high clinical concern (particularly posterior fossa infarct) MR may be considered. This would also help further evaluate nonspecific white matter type  changes.   Electronically Signed   By: Chauncey Cruel M.D.   On: 04/24/2014 16:01   Dg Chest Port 1 View  04/24/2014   CLINICAL DATA:  Mid chest pain. Severe shortness of breath. Ex-smoker.  EXAM: PORTABLE CHEST - 1 VIEW  COMPARISON:  02/24/2014 and chest CTA dated 02/24/2014.  FINDINGS: Normal sized heart. Clear lungs. The lungs remain mildly hyperexpanded with mildly prominent interstitial markings. Unremarkable bones.  IMPRESSION: No acute abnormality.  Stable mild changes of COPD.   Electronically Signed   By: Enrique Sack M.D.   On: 04/24/2014 13:20   Medications: I have reviewed the patient's current medications. Scheduled Meds: . ALPRAZolam  0.5 mg Oral BID  . budesonide-formoterol  2 puff Inhalation BID  . fluconazole  200 mg Oral Daily  . heparin  5,000 Units Subcutaneous 3 times per day  . levofloxacin (LEVAQUIN) IV  750 mg Intravenous Q24H  . loratadine  10 mg Oral Daily  . [START ON 04/26/2014] methylPREDNISolone (SOLU-MEDROL) injection  60 mg Intravenous Daily  . morphine  30 mg Oral QID   Continuous Infusions: . sodium chloride 20 mL/hr at 04/24/14 1837  . 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   1. Acute on Chronic Respiratory Failure with Hypercapnia: Patient more alert and oriented this morning. She is breathing comfortably on 2L oxygen via [place]. She was placed on BiPAP overnight, but kept taking off the mask. She received Solu-Medrol 80 mg last night as well as at 10 AM this morning. Will continue with duonebs and steroids. Patient's home COPD regimen will need to be adjusted before discharge since she is on a number of inhalers and likely difficult to be compliant with.  - Transfer out of step-down to  regular bed - Albuterol nebs  - Continue Symbicort  - Decrease Solu-medrol to 60 mg--> likely transition to PO steroids tomorrow  - Mucinex  - Pulse oximetry   2. COPD Exacerbation: GOLD II/III. Will continue with duonebs and steroids as above.  - Albuterol nebs  - Continue Symbicort  - Decrease Solu-medrol to 60 mg --> likely transition to PO steroids tomorrow - Mucinex  - Pulse oximetry   3. HTN: BP 138/70 on admission. Now stable in 120-130s/60-70s. Patient takes Lasix 40-80 mg daily PRN edema.  - Lasix not necessary at this time given no edema on exam   4. GERD: Patient takes Pepcid 20 mg daily at home.  - Restart Pepcid  5. Chronic Pain: Patient has chronic back pain related to her back surgery for previous arachnoid cyst of thoracic spine. She takes Morphine 30 mg PO 4 times daily at home. - Restart home morphine  Diet: Regular DVT/PE PPx: Heparin SQ  Dispo: Disposition is deferred at this time, awaiting improvement of current medical problems. Anticipated discharge in approximately 1-2 day(s).   The patient does have a current PCP Red[facility] Pulling Arna Medici, MD) and does need an [facility] hospital follow-up appointment after discharge.  The patient does not have transportation limitations that hinder transportation to clinic appointments.  .Services Needed at time of discharge: Y = Yes, Blank = No PT:   OT:   RN:   Equipment:   Other:     LOS: 1 day   Albin Felling, MD 04/25/2014, 1:21 PM

## 2014-04-25 NOTE — Progress Notes (Signed)
Pt tx [facility] per MD order, pt tol well, pt and family verbalized understanding of tx, report called to receiving RN all questions answered

## 2014-04-25 NOTE — H&P (Signed)
Internal Medicine Attending Admission Note Date: 04/25/2014  Patient name: [name] Medical record number: [id number] Date of birth: [birth year] Age: 43 y.o. Gender: female  I saw and evaluated the patient. I reviewed the resident's note and I agree with the resident's findings and plan as documented in the resident's note.  Chief Complaint(s): Shortness of breath x 2 days  History - key components related to admission:  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 She was also noted to be less responsive per family. For the week prior to the onset of these symptoms she's had a cough productive of green sputum for which she initially was started on Levaquin but her symptoms progressed. Her family claims she's been compliant with her pulmonary medications which are prescribed by her pulmonologist Dr. Melvyn Novas. Apparently she is smoked all the way up until admission.  In the emergency department she was noted to be tachypneic and somnolent. An arterial blood gas presumably on 3 L nasal cannula was 7.41/70/98. She was admitted to the internal medicine teaching service for further evaluation and care.  Physical Exam - key components related to admission:  Filed Vitals:   04/25/14 0952 04/25/14 1100 04/25/14 1139 04/25/14 1500  BP:   110/77   Pulse:   103   Temp:  97.9 F (36.6 C)  97.7 F (36.5 C)  TempSrc:  Axillary  Axillary  Resp:   15   Height:      Weight:      SpO2: 93%  93%    General: Well-developed, well-nourished, woman lying comfortably in bed in no acute distress. She is alert and oriented x3 and answers questions appropriately. Lungs: Barrel chested, poor air movement, markedly prolonged  expiratory phase with end expiratory faint wheezes. Occasional rhonchi. Extremities: Without clubbing.  Lab results:  Basic Metabolic Panel:  Recent Labs  04/24/14 1248 04/25/14 0320  NA 138 138  K 4.2 4.7  CL 90* 92*  CO2 40* 36*  GLUCOSE 137* 140*  BUN 8 14  CREATININE 0.60 0.49*  CALCIUM 9.5 9.5   Liver Function Tests:  Recent Labs  04/24/14 1248 04/25/14 0320  AST 33 26  ALT 26 19  ALKPHOS 70 59  BILITOT 0.5 0.5  PROT 6.8 6.1  ALBUMIN 3.8 3.2*   CBC:  Recent Labs  04/24/14 1248 04/25/14 0320  WBC 10.9* 6.9  HGB 14.2 13.2  HCT 44.5 40.1  MCV 108.3* 104.4*  PLT 165 198   Cardiac Enzymes:  Recent Labs  04/24/14 1248  TROPONINI <0.30   Imaging results:  Ct Head Wo Contrast  04/24/2014   CLINICAL DATA:  43 year old female with altered mental status. Headache. Hypertension. Arachnoid cyst. Initial encounter.  EXAM: CT HEAD WITHOUT CONTRAST  TECHNIQUE: Contiguous axial images were obtained from the base of the skull through the vertex without intravenous contrast.  COMPARISON:  01/04/2010 CT.  11/12/2004 MR.  FINDINGS: Examination is slightly motion degraded.  No intracranial hemorrhage.  Nonspecific white matter type changes without CT evidence of large acute infarct. If infarct is of high clinical concern (particularly posterior fossa infarct) MR may be considered.  No hydrocephalus.  No intracranial mass lesion noted on this unenhanced exam.  Cerebellar tonsils slightly low lying within the range normal limits.  Mild exophthalmos.  Mastoid air cells, middle ear cavities and visualized paranasal sinuses are clear.  IMPRESSION: Examination is slightly motion degraded.  No intracranial hemorrhage.  Nonspecific white matter type changes without CT evidence of large acute infarct. If infarct is of high clinical concern (particularly posterior fossa infarct) MR may be considered. This would also help further evaluate nonspecific white matter type changes.    Electronically Signed   By: Chauncey Cruel M.D.   On: 04/24/2014 16:01   Dg Chest Port 1 View  04/24/2014   CLINICAL DATA:  Mid chest pain. Severe shortness of breath. Ex-smoker.  EXAM: PORTABLE CHEST - 1 VIEW  COMPARISON:  02/24/2014 and chest CTA dated 02/24/2014.  FINDINGS: Normal sized heart. Clear lungs. The lungs remain mildly hyperexpanded with mildly prominent interstitial markings. Unremarkable bones.  IMPRESSION: No acute abnormality.  Stable mild changes of COPD.   Electronically Signed   By: Enrique Sack M.D.   On: 04/24/2014 13:20   Other results:  EKG: Normal sinus rhythm at 71 beats per minute, normal axis, normal intervals, no significant Q waves, no LVH by voltage, good R wave progression, no ST-T changes. No comparisons available.  Assessment & Plan by Problem:  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 I believe this is secondary to an acute COPD exacerbation from a viral or bacterial bronchitis. Continued abuse of tobacco is not helping the situation given the severity of her underlying chronic obstructive pulmonary disease. There also seems to be confusion as to what her regimen is or should be and I am unable to decipher specifically the desired regimen she has been prescribed after reviewing the most recent pulmonary clinic note. Although she has an acute exacerbation of her chronic obstructive pulmonary disease she does not have acute on chronic ventilatory failure as her pH is 7.41 with a PaCO2 of 70 and a bicarbonate over 40 suggesting her ventilatory failure is chronic and stable. That being said, she does have some component of hypoxic drive and excessive oxygen would be detrimental. After  observing her this morning on rounds her saturations are in the low 90's on 2 L nasal cannula while at rest. Her oxygen should not be increased higher than this when she is at rest.  1) Acute exacerbation of her chronic obstructive pulmonary disease: We will treat the underlying bronchitis with a course of oral Levaquin. For her exacerbation she will be placed on 30 mg of prednisone daily. She will be given albuterol 2 puffs every 4 hours when necessary with the use of a spacer and will be taught spacer usage by respiratory therapy while an inpatient. She will also be maintained on her home dose of Spiriva. Given the severity of her COPD with her FEV1 being less than 50% of predicted and now requiring at least 2 admissions for COPD exacerbation within the last month to month and a half she would benefit from inhaled steroids if she were to ever come off of the oral steroids. We will therefore place her on a combination inhaled steroid and long acting beta-agonist such as Advair. If she continues to do well she can be discharged home tomorrow but needs to followup very quickly with her pulmonologist to clarify with her her regimen that she should be taking and assure  that she is compliant and with good technique. She states she has now quit smoking and we will offer her pharmacotherapy for smoking cessation on discharge to enhance her chances of staying off of the tobacco. Tobacco cessation is the best thing she can do for her long-term health. She will be discharged on home oxygen at 2 L a minute per nasal cannula.  2) Disposition: If she continues to do well overnight she should be stable for discharge home tomorrow with quick followup with her pulmonologist to assure she continues to do well and clarify with her her regimen and technique.

## 2014-04-25 NOTE — Progress Notes (Signed)
NURSING PROGRESS NOTE  SOLACE MANWARREN [id number] Transfer Data: 04/25/2014 5:00 PM Attending Provider: Karren Cobble, MD XAJ:OINOMV,EHMCNO Danne Baxter, MD Code Status: [place]  [name] is a 43 y.o. female patient transferred from [place]  -No acute distress noted.  -No complaints of shortness of breath.  -No complaints of chest pain.     Blood pressure 118/81, pulse 105, temperature 97.8 F (36.6 C), temperature source Oral, resp. rate 18, height 5\' 2"  (1.575 m), weight 57.1 kg (125 lb 14.1 oz), SpO2 91.00%.   IV Fluids:  IV in place, occlusive dsg intact without redness, IV cath Right a/c running KVO. Allergies:  Cymbalta; Pregabalin; and Wellbutrin  Past Medical History:   has a past medical history of Arachnoid cyst; Tumor, thyroid; COPD (chronic obstructive pulmonary disease); GERD (gastroesophageal reflux disease); and Hypertension.  Past Surgical History:   has past surgical history that includes Back surgery (2006) and Tubal ligation (2001).  Social History:   reports that she quit smoking about 8 months ago. Her smoking use included Cigarettes. She has a 6.25 pack-year smoking history. She has never used smokeless tobacco. She reports that she does not drink alcohol or use illicit drugs.  Skin: Skin tear noted to Right arm, covered with dry protective dsg.   Patient/Family orientated to room. Information packet given to patient/family. Admission inpatient armband information verified with patient/family to include name and date of birth and placed on patient arm. Side rails up x 2, fall assessment and education completed with patient/family. Patient/family able to verbalize understanding of risk associated with falls and verbalized understanding to call for assistance before getting out of bed. Call light within reach. Patient/family able to voice and demonstrate understanding of unit orientation instructions.    Will continue to evaluate and treat per MD  orders.

## 2014-04-26 ENCOUNTER — Telehealth (HOSPITAL_COMMUNITY): Payer: Self-pay

## 2014-04-26 ENCOUNTER — Telehealth: Payer: Self-pay | Admitting: Internal Medicine

## 2014-04-26 DIAGNOSIS — J441 Chronic obstructive pulmonary disease with (acute) exacerbation: Secondary | ICD-10-CM

## 2014-04-26 DIAGNOSIS — G9340 Encephalopathy, unspecified: Secondary | ICD-10-CM

## 2014-04-26 LAB — BASIC METABOLIC PANEL
Anion gap: 8 (ref 5–15)
BUN: 20 mg/dL (ref 6–23)
CO2: 37 mEq/L — ABNORMAL HIGH (ref 19–32)
CREATININE: 0.58 mg/dL (ref 0.50–1.10)
Calcium: 9.7 mg/dL (ref 8.4–10.5)
Chloride: 95 mEq/L — ABNORMAL LOW (ref 96–112)
GFR calc Af Amer: >90 mL/min (ref >90)
Glucose, Bld: 161 mg/dL — ABNORMAL HIGH (ref 70–99)
Potassium: 4.6 mEq/L (ref 3.7–5.3)
Sodium: 140 mEq/L (ref 137–147)

## 2014-04-26 LAB — NICOTINE SCREEN, URINE
COTININE, URINE: 424 ng/mL
Nicotine, urine: 160 ng/mL

## 2014-04-26 MED ORDER — MORPHINE SULFATE 15 MG PO TABS
15.0000 mg | ORAL_TABLET | Freq: Once | ORAL | Status: AC
  Administered 2014-04-26: 15 mg via ORAL
  Filled 2014-04-26: qty 1

## 2014-04-26 MED ORDER — IPRATROPIUM-ALBUTEROL 0.5-2.5 (3) MG/3ML IN SOLN
3.0000 mL | Freq: Four times a day (QID) | RESPIRATORY_TRACT | Status: DC
  Administered 2014-04-26 – 2014-04-27 (×4): 3 mL via RESPIRATORY_TRACT
  Filled 2014-04-26 (×4): qty 3

## 2014-04-26 MED ORDER — ALBUTEROL SULFATE (2.5 MG/3ML) 0.083% IN NEBU: 2.5000 mg | INHALATION_SOLUTION | RESPIRATORY_TRACT | Status: DC | PRN

## 2014-04-26 MED ORDER — IPRATROPIUM-ALBUTEROL 0.5-2.5 (3) MG/3ML IN SOLN
3.0000 mL | RESPIRATORY_TRACT | Status: DC
  Administered 2014-04-26: 3 mL via RESPIRATORY_TRACT
  Filled 2014-04-26: qty 3

## 2014-04-26 MED ORDER — PREDNISONE 50 MG PO TABS
60.0000 mg | ORAL_TABLET | Freq: Every day | ORAL | Status: DC
  Administered 2014-04-27: 60 mg via ORAL
  Filled 2014-04-26: qty 1

## 2014-04-26 NOTE — Telephone Encounter (Signed)
lmomtcb x1 for pt 

## 2014-04-26 NOTE — Progress Notes (Signed)
Pt seen and examined with Dr. Arcelia Jew. Please refer to resident note for details  Pt states breathing has improved but still complains of persistent wheezing.   Exam: Gen: AAO*3, NAD CV: RRR, normal heart sounds Pulm: b/l expiratory wheeze + Abd: soft, non tender, BS + Ext: no edema  Assessment and Plan: 43 y/o female with acute COPD exacerbation  Acute COPD exacerbation: - c/w albuterol nebs, symbicort, O2 via [place] - D/c solumedrol and start prednisone 60 mg with taper to home dose of 10 mg 2 * day - Will consider d/c home today if patient continues to improve - Outpatient follow up with [place]. Pt wants to f/u with Dr. Joya Gaskins.  Likely d/c home today if remains stable

## 2014-04-26 NOTE — Progress Notes (Signed)
Dr Arcelia Jew notified of patient's oxygen levels while at rest and walking. Joslyn Hy, MSN, RN, [employer]

## 2014-04-26 NOTE — Progress Notes (Signed)
Subjective: Patient reports her breathing has improved. She is saturating well at 94-96% on 3 L oxygen via [place].  Objective: Vital signs in last 24 hours: Filed Vitals:   04/26/14 0432 04/26/14 0546 04/26/14 0930 04/26/14 1217  BP:  107/66    Pulse:  61    Temp:  97.5 F (36.4 C)    TempSrc:  Oral    Resp:  16    Height:      Weight:      SpO2: 94% 97% 96% 94%   Weight change:   Intake/Output Summary (Last 24 hours) at 04/26/14 1332 Last data filed at 04/26/14 0830  Gross per 24 hour  Intake  592.5 ml  Output    700 ml  Net -107.5 ml   Physical Exam General: alert, pleasant, sitting up in bed HEENT: [place]/AT, EOMI, mucus membranes moist CV: RRR, normal S1/S2, no m/g/r Pulm: mild wheezing bilaterally, moving good volumes of air on 3 L oxygen via  Abd: BS+, soft, non-tender Ext: warm, no edema, moves all Neuro: alert and oriented x 3, CNs II-XII intact  Lab Results: Basic Metabolic Panel:  Recent Labs Lab 04/25/14 0320 04/26/14 0538  NA 138 140  K 4.7 4.6  CL 92* 95*  CO2 36* 37*  GLUCOSE 140* 161*  BUN 14 20  CREATININE 0.49* 0.58  CALCIUM 9.5 9.7   Liver Function Tests:  Recent Labs Lab 04/24/14 1248 04/25/14 0320  AST 33 26  ALT 26 19  ALKPHOS 70 59  BILITOT 0.5 0.5  PROT 6.8 6.1  ALBUMIN 3.8 3.2*   No results found for this basename: LIPASE, AMYLASE,  in the last 168 hours No results found for this basename: AMMONIA,  in the last 168 hours CBC:  Recent Labs Lab 04/24/14 1248 04/25/14 0320  WBC 10.9* 6.9  HGB 14.2 13.2  HCT 44.5 40.1  MCV 108.3* 104.4*  PLT 165 198   Cardiac Enzymes:  Recent Labs Lab 04/24/14 1248  TROPONINI <0.30   BNP:  Recent Labs Lab 04/24/14 1248  PROBNP 516.2*   D-Dimer: No results found for this basename: DDIMER,  in the last 168 hours CBG: No results found for this basename: GLUCAP,  in the last 168 hours Hemoglobin A1C: No results found for this basename: HGBA1C,  in the last 168  hours Fasting Lipid Panel: No results found for this basename: CHOL, HDL, LDLCALC, TRIG, CHOLHDL, LDLDIRECT,  in the last 168 hours Thyroid Function Tests: No results found for this basename: TSH, T4TOTAL, FREET4, T3FREE, THYROIDAB,  in the last 168 hours Coagulation: No results found for this basename: LABPROT, INR,  in the last 168 hours Anemia Panel: No results found for this basename: VITAMINB12, FOLATE, FERRITIN, TIBC, IRON, RETICCTPCT,  in the last 168 hours Urine Drug Screen: Drugs of Abuse     Component Value Date/Time   LABOPIA POSITIVE* 01/04/2010 2017   [place] 01/04/2010 2017   LABBENZ POSITIVE* 01/04/2010 2017   AMPHETMU NONE DETECTED 01/04/2010 2017   THCU NONE DETECTED 01/04/2010 2017   LABBARB  Value: NONE DETECTED        DRUG SCREEN FOR MEDICAL PURPOSES ONLY.  IF CONFIRMATION IS NEEDED FOR ANY PURPOSE, NOTIFY LAB WITHIN 5 DAYS.        LOWEST DETECTABLE LIMITS FOR URINE DRUG SCREEN Drug Class       Cutoff (ng/mL) Amphetamine      1000 Barbiturate      200 Benzodiazepine   502 Tricyclics  300 Opiates          300 Cocaine          300 THC              50 01/04/2010 2017    Alcohol Level: No results found for this basename: ETH,  in the last 168 hours Urinalysis: No results found for this basename: COLORURINE, APPERANCEUR, LABSPEC, [place], GLUCOSEU, HGBUR, BILIRUBINUR, KETONESUR, PROTEINUR, UROBILINOGEN, NITRITE, LEUKOCYTESUR,  in the last 168 hours   Micro Results: Recent Results (from the past 240 hour(s))  CULTURE, EXPECTORATED SPUTUM-ASSESSMENT     Status: None   Collection Time    04/25/14  1:40 AM      Result Value Ref Range Status   Specimen Description SPUTUM   Final   Special Requests NONE   Final   Sputum evaluation     Final   Value: MICROSCOPIC FINDINGS SUGGEST THAT THIS SPECIMEN IS NOT REPRESENTATIVE OF LOWER RESPIRATORY SECRETIONS. PLEASE RECOLLECT.     RESULT CALLED TO, READ BACK BY AND VERIFIED WITH: Milta Deiters RN [id number]  GREEN R   Report Status 04/25/2014 FINAL   Final  MRSA PCR SCREENING     Status: None   Collection Time    04/25/14  1:52 AM      Result Value Ref Range Status   MRSA by PCR NEGATIVE  NEGATIVE Final   Comment:            The GeneXpert MRSA Assay (FDA     approved for NASAL specimens     only), is one component of a     comprehensive MRSA colonization     surveillance program. It is not     intended to diagnose MRSA     infection nor to guide or     monitor treatment for     MRSA infections.   Studies/Results: Ct Head Wo Contrast  04/24/2014   CLINICAL DATA:  43 year old female with altered mental status. Headache. Hypertension. Arachnoid cyst. Initial encounter.  EXAM: CT HEAD WITHOUT CONTRAST  TECHNIQUE: Contiguous axial images were obtained from the base of the skull through the vertex without intravenous contrast.  COMPARISON:  01/04/2010 CT.  11/12/2004 MR.  FINDINGS: Examination is slightly motion degraded.  No intracranial hemorrhage.  Nonspecific white matter type changes without CT evidence of large acute infarct. If infarct is of high clinical concern (particularly posterior fossa infarct) MR may be considered.  No hydrocephalus.  No intracranial mass lesion noted on this unenhanced exam.  Cerebellar tonsils slightly low lying within the range normal limits.  Mild exophthalmos.  Mastoid air cells, middle ear cavities and visualized paranasal sinuses are clear.  IMPRESSION: Examination is slightly motion degraded.  No intracranial hemorrhage.  Nonspecific white matter type changes without CT evidence of large acute infarct. If infarct is of high clinical concern (particularly posterior fossa infarct) MR may be considered. This would also help further evaluate nonspecific white matter type changes.   Electronically Signed   By: Chauncey Cruel M.D.   On: 04/24/2014 16:01   Medications: I have reviewed the patient's current medications. Scheduled Meds: . ALPRAZolam  0.5 mg Oral BID  .  budesonide-formoterol  2 puff Inhalation BID  . famotidine  20 mg Oral Daily  . fluconazole  200 mg Oral Daily  . heparin  5,000 Units Subcutaneous 3 times per day  . ipratropium-albuterol  3 mL Nebulization QID  . levofloxacin  750 mg Oral Daily  . loratadine  10 mg Oral Daily  . morphine  30 mg Oral QID  . [START ON 04/27/2014] predniSONE  60 mg Oral Q1200   Continuous Infusions: . sodium chloride 10 mL/hr (04/25/14 1815)  . albuterol     PRN Meds:.acetaminophen, dextromethorphan-guaiFENesin Assessment/Plan: Ms. Overstreet is a 43yo woman w/ PMHx of arachnoid cyst of the thoracic spine s/p laminectomy, COPD on home oxygen, HTN, and GERD who presented to the ED with acute on chronic respiratory failure with hypercapnia.   1. Acute COPD Exacerbation: Patient more alert and oriented this morning. She is breathing comfortably on 3L oxygen via [place]. She received Solu-Medrol 60 mg IV today. Will transition to PO Prednisone today. Patient can be discharged home today if does well this afternoon. [facility] pulmonology was contacted and they will follow up with her to schedule an appointment. Will d/c on Prednisone 60 mg taper, Symbicort BID, Spiriva daily, and Albuterol as needed.   - Albuterol nebs  - Continue Symbicort  - Prednisone 60 mg daily--> will d/c on taper - Mucinex  - Pulse oximetry   2. HTN: BP 138/70 on admission. Now stable in 110-130s/60-80s. Patient takes Lasix 40-80 mg daily PRN edema.  - Lasix not necessary at this time given no edema on exam   3. GERD: Patient takes Pepcid 20 mg daily at home.  - Continue Pepcid   4. Chronic Pain: Patient has chronic back pain related to her back surgery for previous arachnoid cyst of thoracic spine. She takes Morphine 30 mg PO 4 times daily at home.  - Continue home morphine   Diet: Regular  DVT/PE PPx: Heparin SQ  Dispo: Discharge likely today  The patient does have a current PCP [facility] Pulling Arna Medici, MD) and does need an [facility] hospital  follow-up appointment after discharge.  The patient does not have transportation limitations that hinder transportation to clinic appointments.  .Services Needed at time of discharge: Y = Yes, Blank = No PT:   OT:   RN:   Equipment:   Other:     LOS: 2 days   Albin Felling, MD 04/26/2014, 1:32 PM

## 2014-04-26 NOTE — Telephone Encounter (Signed)
I have called and left a message with Maribelle to inquire about participation in Pulmonary Rehab. Will send letter in mail and follow up.

## 2014-04-26 NOTE — Progress Notes (Signed)
SATURATION QUALIFICATIONS: (This note is used to comply with regulatory documentation for home oxygen)  Patient Saturations on Room Air at Rest = 87%  Patient Saturations on Room Air while Ambulating = na%  Patient Saturations on 3 Liters of oxygen while Ambulating = 84%  Please briefly explain why patient needs home oxygen:

## 2014-04-27 DIAGNOSIS — J449 Chronic obstructive pulmonary disease, unspecified: Secondary | ICD-10-CM

## 2014-04-27 MED ORDER — PREDNISONE 10 MG PO TABS: ORAL_TABLET | ORAL | Status: DC

## 2014-04-27 MED ORDER — LEVOFLOXACIN 750 MG PO TABS
750.0000 mg | ORAL_TABLET | Freq: Every day | ORAL | Status: DC
Start: 2014-04-27 — End: 2014-04-30

## 2014-04-27 NOTE — Discharge Instructions (Signed)
It was a pleasure taking care of you, Ms. Konen.     Please make sure to follow up with [facility] if you do not hear from them by this Friday, 10/23. Please schedule an appointment as soon as possible to review your medications. You should be taking your Symbicort inhaler and Tudorza inhaler daily, and Albuterol and nebs as needed.   Take care, Dr. Arcelia Jew

## 2014-04-27 NOTE — Progress Notes (Signed)
CARE MANAGEMENT NOTE 04/27/2014  Patient:  NATARA, MONFORT   Account Number:  [id number]  Date Initiated:  04/27/2014  Documentation initiated by:  [facility]  Subjective/Objective Assessment:   COPD exac     Action/Plan:   lives at home with husband. oxygen dependent   Anticipated DC Date:  04/27/2014   Anticipated DC Plan:  [place]  CM consult      Choice offered to / List presented to:             Status of service:  Completed, signed off Medicare Important Message given?   (If response is "NO", the following Medicare IM given date fields will be blank) Date Medicare IM given:   Medicare IM given by:   Date Additional Medicare IM given:   Additional Medicare IM given by:    Discharge Disposition:  HOME/SELF CARE  Per UR Regulation:    If discussed at Long Length of Stay Meetings, dates discussed:    Comments:  No NCM needs identified. Pt has nebulizer at home. Husband brought portable tank to dc home. Jonnie Finner RN CCM Case Mgmt phone [phone]

## 2014-04-27 NOTE — Plan of Care (Signed)
Problem: Phase II Progression Outcomes Goal: O2 sats > equal to 90% on RA or at baseline Outcome: Completed/Met Date Met:  04/27/14 Pt dependent on 2L of oxygen at home.

## 2014-04-27 NOTE — Telephone Encounter (Signed)
LMTCB x2  

## 2014-04-27 NOTE — Progress Notes (Signed)
Subjective: Patient states her breathing is much better today. She walked with her husband down the halls and did well.   Objective: Vital signs in last 24 hours: Filed Vitals:   04/26/14 1822 04/26/14 1956 04/26/14 2133 04/27/14 0503  BP:   132/86 117/89  Pulse:   98 75  Temp:   97.8 F (36.6 C) 97.8 F (36.6 C)  TempSrc:   Oral Oral  Resp:   18 20  Height:      Weight:      SpO2: 95% 96% 97% 98%   Weight change:   Intake/Output Summary (Last 24 hours) at 04/27/14 1056 Last data filed at 04/27/14 0503  Gross per 24 hour  Intake      0 ml  Output    500 ml  Net   -500 ml   Physical Exam General: alert, sitting up in bed, pleasant HEENT: [place]/AT, EOMI, mucus membranes moist CV: RRR, normal S1/S2, no m/g/r Pulm: mild wheezes heard bilaterally, much improved from yesterday, breathing comfortably on 3 L oxygen via [place] Abd: BS+, soft, non-tender Ext: warm, no edema, moves all Neuro: alert and oriented x 3, CNs II-XII intact  Lab Results: Basic Metabolic Panel:  Recent Labs Lab 04/25/14 0320 04/26/14 0538  NA 138 140  K 4.7 4.6  CL 92* 95*  CO2 36* 37*  GLUCOSE 140* 161*  BUN 14 20  CREATININE 0.49* 0.58  CALCIUM 9.5 9.7   Liver Function Tests:  Recent Labs Lab 04/24/14 1248 04/25/14 0320  AST 33 26  ALT 26 19  ALKPHOS 70 59  BILITOT 0.5 0.5  PROT 6.8 6.1  ALBUMIN 3.8 3.2*   No results found for this basename: LIPASE, AMYLASE,  in the last 168 hours No results found for this basename: AMMONIA,  in the last 168 hours CBC:  Recent Labs Lab 04/24/14 1248 04/25/14 0320  WBC 10.9* 6.9  HGB 14.2 13.2  HCT 44.5 40.1  MCV 108.3* 104.4*  PLT 165 198   Cardiac Enzymes:  Recent Labs Lab 04/24/14 1248  TROPONINI <0.30   BNP:  Recent Labs Lab 04/24/14 1248  PROBNP 516.2*   D-Dimer: No results found for this basename: DDIMER,  in the last 168 hours CBG: No results found for this basename: GLUCAP,  in the last 168 hours Hemoglobin A1C: No  results found for this basename: HGBA1C,  in the last 168 hours Fasting Lipid Panel: No results found for this basename: CHOL, HDL, LDLCALC, TRIG, CHOLHDL, LDLDIRECT,  in the last 168 hours Thyroid Function Tests: No results found for this basename: TSH, T4TOTAL, FREET4, T3FREE, THYROIDAB,  in the last 168 hours Coagulation: No results found for this basename: LABPROT, INR,  in the last 168 hours Anemia Panel: No results found for this basename: VITAMINB12, FOLATE, FERRITIN, TIBC, IRON, RETICCTPCT,  in the last 168 hours Urine Drug Screen: Drugs of Abuse     Component Value Date/Time   LABOPIA POSITIVE* 01/04/2010 2017   [place] 01/04/2010 2017   LABBENZ POSITIVE* 01/04/2010 2017   AMPHETMU NONE DETECTED 01/04/2010 2017   THCU NONE DETECTED 01/04/2010 2017   LABBARB  Value: NONE DETECTED        DRUG SCREEN FOR MEDICAL PURPOSES ONLY.  IF CONFIRMATION IS NEEDED FOR ANY PURPOSE, NOTIFY LAB WITHIN 5 DAYS.        LOWEST DETECTABLE LIMITS FOR URINE DRUG SCREEN Drug Class       Cutoff (ng/mL) Amphetamine      1000  Barbiturate      200 Benzodiazepine   720 Tricyclics       947 Opiates          300 Cocaine          300 THC              50 01/04/2010 2017    Alcohol Level: No results found for this basename: ETH,  in the last 168 hours Urinalysis: No results found for this basename: COLORURINE, APPERANCEUR, LABSPEC, [place], GLUCOSEU, HGBUR, BILIRUBINUR, KETONESUR, PROTEINUR, UROBILINOGEN, NITRITE, LEUKOCYTESUR,  in the last 168 hours   Micro Results: Recent Results (from the past 240 hour(s))  CULTURE, EXPECTORATED SPUTUM-ASSESSMENT     Status: None   Collection Time    04/25/14  1:40 AM      Result Value Ref Range Status   Specimen Description SPUTUM   Final   Special Requests NONE   Final   Sputum evaluation     Final   Value: MICROSCOPIC FINDINGS SUGGEST THAT THIS SPECIMEN IS NOT REPRESENTATIVE OF LOWER RESPIRATORY SECRETIONS. PLEASE RECOLLECT.     RESULT CALLED TO, READ  BACK BY AND VERIFIED WITH: Milta Deiters RN [phone] GREEN R   Report Status 04/25/2014 FINAL   Final  MRSA PCR SCREENING     Status: None   Collection Time    04/25/14  1:52 AM      Result Value Ref Range Status   MRSA by PCR NEGATIVE  NEGATIVE Final   Comment:            The GeneXpert MRSA Assay (FDA     approved for NASAL specimens     only), is one component of a     comprehensive MRSA colonization     surveillance program. It is not     intended to diagnose MRSA     infection nor to guide or     monitor treatment for     MRSA infections.   Studies/Results: No results found. Medications: I have reviewed the patient's current medications. Scheduled Meds: . ALPRAZolam  0.5 mg Oral BID  . budesonide-formoterol  2 puff Inhalation BID  . famotidine  20 mg Oral Daily  . fluconazole  200 mg Oral Daily  . heparin  5,000 Units Subcutaneous 3 times per day  . ipratropium-albuterol  3 mL Nebulization QID  . levofloxacin  750 mg Oral Daily  . loratadine  10 mg Oral Daily  . morphine  30 mg Oral QID  . predniSONE  60 mg Oral Q1200   Continuous Infusions: . sodium chloride 10 mL/hr (04/25/14 1815)  . 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   1. Acute COPD Exacerbation- Improving: She is breathing comfortably on 3L oxygen via [place]. She was able to walk the halls this morning with her husband. She was transitioned to PO Prednisone today. Patient can be discharged home today. She will have follow up with [place] in 1 week.  - Albuterol nebs  - Continue Symbicort  - Prednisone 60 mg daily--> will d/c on taper  - Mucinex  - Pulse oximetry   2. HTN: BP 138/70 on admission. Now stable in 110-130s/60-80s. Patient takes Lasix 40-80 mg  daily PRN edema.  - Lasix not necessary at this time given  no edema on exam   3. GERD: Patient takes Pepcid 20 mg daily at home.  - Continue Pepcid   4. Chronic Pain: Patient has chronic back pain related to her back surgery for previous arachnoid cyst of thoracic spine. She takes Morphine 30 mg PO 4 times daily at home.  - Continue home morphine   Diet: Regular  DVT/PE PPx: Heparin SQ  Dispo: Discharge likely today  The patient does have a current PCP [facility] Arna Medici, MD) and does need an [facility] hospital follow-up appointment after discharge.  The patient does not have transportation limitations that hinder transportation to clinic appointments.  .Services Needed at time of discharge: Y = Yes, Blank = No PT:   OT:   RN:   Equipment:   Other:     LOS: 3 days   Albin Felling, MD 04/27/2014, 10:56 AM

## 2014-04-27 NOTE — Discharge Summary (Signed)
[name] to be D/C'd Home per MD order.  Discussed with the patient and all questions fully answered.    Medication List    STOP taking these medications       budesonide 0.5 MG/2ML nebulizer solution  Commonly known as:  PULMICORT     tiotropium 18 MCG inhalation capsule  Commonly known as:  SPIRIVA      TAKE these medications       acetaminophen 500 MG tablet  Commonly known as:  TYLENOL  Take 500 mg by mouth every 6 (six) hours as needed for moderate pain.     ALPRAZolam 0.5 MG tablet  Commonly known as:  XANAX  Take 0.5 mg by mouth 2 (two) times daily.     budesonide-formoterol 160-4.5 MCG/ACT inhaler  Commonly known as:  SYMBICORT  Inhale 2 puffs into the lungs 2 (two) times daily.     cetirizine 10 MG tablet  Commonly known as:  ZYRTEC  Take 10 mg by mouth at bedtime as needed for allergies.     DEXILANT 60 MG capsule  Generic drug:  dexlansoprazole  Take 60 mg by mouth daily.     dextromethorphan-guaiFENesin 30-600 MG per 12 hr tablet  Commonly known as:  MUCINEX DM  Take 1-2 tablets by mouth every 12 (twelve) hours as needed (w/ flutter).     famotidine 20 MG tablet  Commonly known as:  PEPCID  Take 20 mg by mouth daily.     fluconazole 200 MG tablet  Commonly known as:  DIFLUCAN  Take 200 mg by mouth daily. For 7 days     furosemide 80 MG tablet  Commonly known as:  LASIX  Take 40-80 mg by mouth daily as needed for fluid.     ipratropium-albuterol 0.5-2.5 (3) MG/3ML Soln  Commonly known as:  DUONEB  Take 3 mLs by nebulization 4 (four) times daily.     levofloxacin 750 MG tablet  Commonly known as:  LEVAQUIN  Take 1 tablet (750 mg total) by mouth daily.     morphine 30 MG tablet  Commonly known as:  MSIR  Take 30 mg by mouth 4 (four) times daily. May add 1 every 4 hours as needed for severe pain     predniSONE 10 MG tablet  Commonly known as:  DELTASONE  Take 60 mg (6 tabs) for 2 days, 40 mg (4 tabs) for 3 days, 30 mg for 3 days, and  then resume your home dose of 10 mg twice a day until you see your pulmonologist.     PROAIR HFA 108 (90 BASE) MCG/ACT inhaler  Generic drug:  albuterol  Inhale 2 puffs into the lungs every 4 (four) hours as needed for wheezing or shortness of breath ((PLAN B)).     TUDORZA PRESSAIR 400 MCG/ACT Aepb  Generic drug:  Aclidinium Bromide  Inhale 2 puffs into the lungs daily.     Vitamin D (Ergocalciferol) 50000 UNITS Caps capsule  Commonly known as:  DRISDOL  Take 50,000 Units by mouth every 7 (seven) days.        VVS, Skin clean, dry and intact without evidence of skin break down, no evidence of skin tears noted. IV catheter discontinued intact. Site without signs and symptoms of complications. Dressing and pressure applied.  An After Visit Summary was printed and given to the patient.  D/c education completed with patient/family including follow up instructions, medication list, d/c activities limitations if indicated, with other d/c instructions as indicated by  MD - patient able to verbalize understanding, all questions fully answered.   Patient instructed to return to ED, call 911, or call MD for any changes in condition.   Patient escorted via [place], and D/C home via private auto.  Audria Nine F 04/27/2014 2:50 PM

## 2014-04-27 NOTE — Progress Notes (Signed)
Pt seen and examined. Please refer to resident note for details  Pt states breathing has improved today and able to ambulate around the halls.    Exam:  Gen: AAO*3, NAD  CV: RRR, normal heart sounds  Pulm: b/l scattered expiratory wheeze +, improved since yesterday  Abd: soft, non tender, BS +  Ext: no edema   Assessment and Plan:  43 y/o female with acute COPD exacerbation   Acute COPD exacerbation:  - c/w albuterol nebs, symbicort, O2 via [place]  - c/w prednisone 60 mg with taper to home dose of 10 mg 2 * day   - Outpatient follow up with [facility] in 1 week. Pt wants to f/u with Dr. Joya Gaskins.   Pt stable for d/c home today

## 2014-04-28 ENCOUNTER — Encounter: Payer: Self-pay | Admitting: Adult Health

## 2014-04-28 NOTE — Discharge Summary (Signed)
INTERNAL MEDICINE ATTENDING DISCHARGE COSIGN   I evaluated the patient on the day of discharge and discussed the discharge plan with my resident team. I agree with the discharge documentation and disposition.   Niasia Lanphear 04/28/2014, 1:17 PM

## 2014-04-28 NOTE — Telephone Encounter (Signed)
Called and spoke to pt. Pt requesting to change physicians from [facility]. Pt requesting to call pt back tomorrow to see which physician she wants to change to (pt has a family friend that sees a physician in this office and is wanting to change to them).  Appt made for HFU with TP on 10/23.   MW please advise if you are ok with pt changing providers.

## 2014-04-28 NOTE — Discharge Summary (Signed)
Name: [name] MRN: [id number] DOB: 1971-05-22 43 y.o. PCP: Leonard Downing, MD  Date of Admission: 04/24/2014 12:35 PM Date of Discharge: 04/27/2014 Attending Physician: Aldine Contes, MD  Discharge Diagnosis: 1.  Principal Problem:   Acute exacerbation of chronic obstructive pulmonary disease (COPD) Active Problems:   Smoker   Hypercapnic respiratory failure, chronic   Acute encephalopathy  Discharge Medications:   Medication List    STOP taking these medications       budesonide 0.5 MG/2ML nebulizer solution  Commonly known as:  PULMICORT     tiotropium 18 MCG inhalation capsule  Commonly known as:  SPIRIVA      TAKE these medications       acetaminophen 500 MG tablet  Commonly known as:  TYLENOL  Take 500 mg by mouth every 6 (six) hours as needed for moderate pain.     ALPRAZolam 0.5 MG tablet  Commonly known as:  XANAX  Take 0.5 mg by mouth 2 (two) times daily.     budesonide-formoterol 160-4.5 MCG/ACT inhaler  Commonly known as:  SYMBICORT  Inhale 2 puffs into the lungs 2 (two) times daily.     cetirizine 10 MG tablet  Commonly known as:  ZYRTEC  Take 10 mg by mouth at bedtime as needed for allergies.     DEXILANT 60 MG capsule  Generic drug:  dexlansoprazole  Take 60 mg by mouth daily.     dextromethorphan-guaiFENesin 30-600 MG per 12 hr tablet  Commonly known as:  MUCINEX DM  Take 1-2 tablets by mouth every 12 (twelve) hours as needed (w/ flutter).     famotidine 20 MG tablet  Commonly known as:  PEPCID  Take 20 mg by mouth daily.     fluconazole 200 MG tablet  Commonly known as:  DIFLUCAN  Take 200 mg by mouth daily. For 7 days     furosemide 80 MG tablet  Commonly known as:  LASIX  Take 40-80 mg by mouth daily as needed for fluid.     ipratropium-albuterol 0.5-2.5 (3) MG/3ML Soln  Commonly known as:  DUONEB  Take 3 mLs by nebulization 4 (four) times daily.     levofloxacin 750 MG tablet  Commonly known as:   LEVAQUIN  Take 1 tablet (750 mg total) by mouth daily.     morphine 30 MG tablet  Commonly known as:  MSIR  Take 30 mg by mouth 4 (four) times daily. May add 1 every 4 hours as needed for severe pain     predniSONE 10 MG tablet  Commonly known as:  DELTASONE  Take 60 mg (6 tabs) for 2 days, 40 mg (4 tabs) for 3 days, 30 mg for 3 days, and then resume your home dose of 10 mg twice a day until you see your pulmonologist.     PROAIR HFA 108 (90 BASE) MCG/ACT inhaler  Generic drug:  albuterol  Inhale 2 puffs into the lungs every 4 (four) hours as needed for wheezing or shortness of breath ((PLAN B)).     TUDORZA PRESSAIR 400 MCG/ACT Aepb  Generic drug:  Aclidinium Bromide  Inhale 2 puffs into the lungs daily.     Vitamin D (Ergocalciferol) 50000 UNITS Caps capsule  Commonly known as:  DRISDOL  Take 50,000 Units by mouth every 7 (seven) days.        Disposition and follow-up:   TabithaCarin D Hunt was discharged from [facility] in Good condition.  At the hospital follow  up visit please address:  1.  COPD Medications: Patient presented to hospital with multiple home meds/inhalers. She was d/c'd on Tudorza daily, Symbicort BID, and Albuterol PRN and Duonebs PRN. Please clarify with patient which inhalers she should be on.  2.  Labs / imaging needed at time of follow-up: None  3.  Pending labs/ test needing follow-up: None  Follow-up Appointments: Follow-up Information   Follow up with [facility]. (Please call if you do not hear from the Pulmonology office by Friday. I was notified that they will contact you for an appointment. )    Contact information:   [place] [id number]       Follow up with Leonard Downing, MD. Schedule an appointment as soon as possible for a visit in 1 week.   Specialty:  Family Medicine   Contact information:   [place] [address] [phone]       Discharge  Instructions: Discharge Instructions   Diet - low sodium heart healthy    Complete by:  As directed      Increase activity slowly    Complete by:  As directed            Consultations:    Procedures Performed:  Ct Head Wo Contrast  04/24/2014   CLINICAL DATA:  43 year old female with altered mental status. Headache. Hypertension. Arachnoid cyst. Initial encounter.  EXAM: CT HEAD WITHOUT CONTRAST  TECHNIQUE: Contiguous axial images were obtained from the base of the skull through the vertex without intravenous contrast.  COMPARISON:  01/04/2010 CT.  11/12/2004 MR.  FINDINGS: Examination is slightly motion degraded.  No intracranial hemorrhage.  Nonspecific white matter type changes without CT evidence of large acute infarct. If infarct is of high clinical concern (particularly posterior fossa infarct) MR may be considered.  No hydrocephalus.  No intracranial mass lesion noted on this unenhanced exam.  Cerebellar tonsils slightly low lying within the range normal limits.  Mild exophthalmos.  Mastoid air cells, middle ear cavities and visualized paranasal sinuses are clear.  IMPRESSION: Examination is slightly motion degraded.  No intracranial hemorrhage.  Nonspecific white matter type changes without CT evidence of large acute infarct. If infarct is of high clinical concern (particularly posterior fossa infarct) MR may be considered. This would also help further evaluate nonspecific white matter type changes.   Electronically Signed   By: Chauncey Cruel M.D.   On: 04/24/2014 16:01   Dg Chest Port 1 View  04/24/2014   CLINICAL DATA:  Mid chest pain. Severe shortness of breath. Ex-smoker.  EXAM: PORTABLE CHEST - 1 VIEW  COMPARISON:  02/24/2014 and chest CTA dated 02/24/2014.  FINDINGS: Normal sized heart. Clear lungs. The lungs remain mildly hyperexpanded with mildly prominent interstitial markings. Unremarkable bones.  IMPRESSION: No acute abnormality.  Stable mild changes of COPD.   Electronically  Signed   By: Enrique Sack M.D.   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 Patient was unable to give a history due to altered mental status. Her husband and daughter were present to give a history. Family reports the patient has had severe SOB for the past 1-2 days and then this morning she was unresponsive. Family notes she also was complaining of a headache yesterday. Family notes she was able to  verbalize to them yesterday, but she was not able to talk to them at all today. Family denies any history of depression, suicidal ideation, or narcotic abuse. Patient does take MS Contin at home. Family denies any empty pill bottles at home or misuse of medications. Family reports she was hospitalized at [facility] 1.5 months ago and was in the ICU for her COPD. She was put on oxygen at that time. She now uses 2-3 L of oxygen at home. Family notes she has had a cough productive of green sputum for the past few weeks. Family states she was started on Levaquin a few weeks ago and was just given another prescription to continue Levaquin. She follows with Dr. Melvyn Novas (pulmonology). Family reports she is compliant with her medications and is on Prednisone 10 mg BID.   In the ED, an ABG was done that showed pH 7.4, pCO2 70, pO2 98, bicarb 44, and O2 sat 97%. Her WBC count found to be 10.9. CT head negative. CXR negative. Patient noted to have maculopapular rash on her abdomen and back. Family reports the rash was not present yesterday.    Hospital Course by problem list: Principal Problem:   Acute exacerbation of chronic obstructive pulmonary disease (COPD) Active Problems:   Smoker   Hypercapnic respiratory failure, chronic   Acute encephalopathy   1. Acute Exacerbation of COPD: Patient presented with 2 day history of severe SOB and was found to have  AMS that morning by her family. She had an ABG that showed pH 7.4, pCO2 70, pO2 98, bicarb 44, and O2 sat 97%, indicating chronic CO2 retention. Initially there was concern for possible meningitis given AMS and new rash, but patient became more alert and did not have meningeal signs. Narcotic abuse/overdose was also a concern, but according to family she has no history of narcotic abuse or suicidal ideation. Her UDS was appropriately positive for benzos and opiates. Patient's home COPD meds included Tudorza daily, Albuterol PRN, Symbicort BID, Duonebs PRN, Pulmicort BID, and Prednisone 10 mg BID. Patient's family reported she has good compliance with her medications and when patient's mental status improved she also noted good compliance. Her respiratory failure was likely due to an exacerbation of her COPD. She was placed on BiPAP overnight, but the patient kept taking off the mask and was breathing comfortably on 2-3 L oxygen via [place]. She was also placed on Symbicort, Solu-Medrol 80 mg BID, and Duonebs. She continued to improve and her steroids were tapered to Solu-medrol 60 mg daily and then she was transitioned to Prednisone 60 mg daily. She was discharged on her home Tudorza, Symbicort, and Albuterol PRN, as well as a Prednisone taper. She is a patient of Dr. Gustavus Bryant, but the patient expressed interest in switching pulmonologists. The [facility] practice was contacted and they have agreed to call the patient to set up a follow up appointment. The patient was made aware that they will be contacted for an appointment and to call [facility] pulmonology if they do not hear from them by 10/23. The patient's home COPD medications should be reviewed with her pulmonologist and clarified with the patient.   2. HTN: BP was 138/70 on admission. Her BP was soft in 90s/60s initially, but then improved to 120s-130s/60-70s. She takes Lasix 40-80 mg daily PRN edema. She was not given Lasix during this admission because she had no  peripheral edema and no signs of volume overload. She was discharged on her home Lasix.   3. GERD: Patient's  home Pepcid 20 mg daily was initially held due to altered mental status. Her Pepcid was resumed the following day since her mental status had improved dramatically. She was discharged on her home Pepcid dose.   Discharge Vitals:   BP 122/89  Pulse 80  Temp(Src) 98.1 F (36.7 C) (Oral)  Resp 18  Ht 5\' 2"  (1.575 m)  Wt 125 lb 14.1 oz (57.1 kg)  BMI 23.02 kg/m2  SpO2 98% Physical Exam  General: alert, sitting up in bed, pleasant  HEENT: [place]/AT, EOMI, mucus membranes moist CV: RRR, normal S1/S2, no m/g/r  Pulm: mild wheezes heard bilaterally, much improved from yesterday, breathing comfortably on 3 L oxygen via   Abd: BS+, soft, non-tender  Ext: warm, no edema, moves all  Neuro: alert and oriented x 3, CNs II-XII intact  Discharge Labs:  No results found for this or any previous visit (from the past 24 hour(s)).  Signed: Albin Felling, MD 04/28/2014, 10:56 AM    Services Ordered on Discharge: None Equipment Ordered on Discharge: None

## 2014-04-28 NOTE — Progress Notes (Signed)
Quick Note:  lmtcb X1 ______ 

## 2014-04-28 NOTE — Telephone Encounter (Signed)
Fine with me

## 2014-04-28 NOTE — Telephone Encounter (Signed)
Pt requests call back tomorrow to discuss this change. Pt states that she is going to talk to a friend about a recommendation of a new Dr here.  Will hold in triage to be addressed tomorrow

## 2014-04-30 ENCOUNTER — Inpatient Hospital Stay: Payer: Medicaid Other | Admitting: Adult Health

## 2014-04-30 ENCOUNTER — Other Ambulatory Visit: Payer: Medicaid Other

## 2014-04-30 ENCOUNTER — Encounter: Payer: Self-pay | Admitting: Adult Health

## 2014-04-30 ENCOUNTER — Ambulatory Visit (INDEPENDENT_AMBULATORY_CARE_PROVIDER_SITE_OTHER): Payer: Medicaid Other | Admitting: Adult Health

## 2014-04-30 VITALS — BP 100/60 | HR 110 | Temp 97.2°F | Ht <= 58 in | Wt 121.6 lb

## 2014-04-30 DIAGNOSIS — J441 Chronic obstructive pulmonary disease with (acute) exacerbation: Secondary | ICD-10-CM

## 2014-04-30 DIAGNOSIS — J449 Chronic obstructive pulmonary disease, unspecified: Secondary | ICD-10-CM

## 2014-04-30 NOTE — Progress Notes (Signed)
Quick Note:  Pt aware - discussed at 04/30/14 ov w/ TP. ______

## 2014-04-30 NOTE — Assessment & Plan Note (Signed)
Slow to resolve exacerbation  Long discussion regarding her condition , severe stage of COPD and prognosis.  Offered referral to [facility] for second opionion , family to discuss.  Check sputum cx today .   Plan  Wear oxygen 2l/m at rest and 3l/m with walking  Taper Prednisone as directed.  Sputum culture today .  Work on no smoking  Follow up in 2 weeks and As needed   Please contact office for sooner follow up if symptoms do not improve or worsen or seek emergency care

## 2014-04-30 NOTE — Progress Notes (Signed)
Subjective:    Patient ID: [name]    DOB: 12-15-1970    MRN: [id number]   Brief patient profile:  54  yowf smoker used 02 tent as infant and in 4th or 5th grade then did fine thru HS cheerleading and tol ex well then except  much worse sob  assoc with episodes of severe bronchitis then even in between episodes of  Bronchitis developed chronic doe around  2010 referred 08/23/2011 by Dr Arelia Sneddon to pulmonary clinic with GOLD II/III COPD by pft's 11/29/2011    HPI  03/16/2013 f/u ov/Wert stopped all meds March 2014 then downhill since then Chief Complaint  Patient presents with  . Follow-up    Pt states having increased SOB for the past 3 months.  She states has SOB with or without exertion. She has prod cough for the past 2 months-prod with green to yellow sputum.   still smoking but not daily. On acei with sensation of choking/throat irritation /sever cough not much better on aug/pred rx  >d/c ace , PPI /pepcid   04/07/2013 Follow up and med review  She is brought all her medications and her review We reviewed all her medications and organized them into a medication calendar with patient education It appears she is taking meds correctly.   Complains of  prod cough with thick green/yellow mucus, increased SOB, wheezing, chest tightness, hoarseness, some low grade temp x15 days Went to ENT [place] screening last week for hoarseness was  Informed throat was ok. But to come back for more detailed exam.  No hemoptysis, chest pain, orthopnea, edema.  Has lost 10 lbs over last year.  Last ov ACE was stopped , has not seen any change in hoarseness or cough.  Still smoking , cessation discussed.  rec Omnicef 300mg  Twice daily  For 7 days  Prednisone taper over next week.  Must quit smoking .  I will call with chest xray  And lab results.  Brush /rinse and gargle after use  May use Armour/hammer baking soda/peroxide toothpaste  Mucinex DM Twice daily  As needed  Cough/congestion   Follow med calendar closely and bring to each visit      08/28/2013 f/u ov/Wert re: worse cough x 2 months/ still smoking / using med calendar / on 20 mg prednisone Chief Complaint  Patient presents with  . Follow-up    Pt still SOB, prod cough with green mucous.     coughing greeen since Xmas esp in am, some better p saba hfa but insists on using the neb first. >>augmentin x 10 d   10/30/13 Follow up and Med Review  Patient returns for a followup and medication review. Reviewed all her medications organized them into a medication calendar. Patient education. Patient has not smoked, encouraged on continued smoking cessation. Last visit. Patient had a slow to resolve bronchitis/ sinusitis She was treated with Augmentin for 10 days The symptoms are much improved She denies any hemoptysis, chest pain, orthopnea, PND, or leg swelling.  >>med calendar    12/14/2013 Acute OV sick since first week in May  Pt complains of increased SOB, wheezing, hoarseness, prod cough with green/yellow mucus for 6 weeks.  Waxing and waning  With congestion.  No fever for last few weeks.  Saw  PCP at onset and has finished 2 rounds augmentin.  fever at onset.  Last abx finished 3 days ago.  Wheezing is worse this last week.  No hemoptysis, chest pain , abd pain, n/v/d.  Husband getting surgery this week.  Mucinex DM is not helping.  Still smoking  rec Chest xray today  Prednisone taper over next week.  Mucinex DM Twice daily  As needed  Cough/congestion      02/05/2014 Follow up and Med Calendar  Patient returns for a 6 week followup and medication review. We reviewed all her medications organized them into a medication calendar with patient education. Appears that she is taking her medications correctly. Patient denies any hemoptysis, orthopnea, PND, or leg swelling. Says she still does not feel well , continues to cough up congestion w/ thick green mucus.  Complains of increased SOB, wheezing,  prod cough with green mucus, tightness since last ov.  Complains of left ear fullness . Was started on Prednisone last visit but has not been able to taper off, remains on Prednisone 20mg  daily  rec Levaquin 500mg  daily for 10 days -take with food Eat yogurt , take probiotic while on antibiotics Prednisone taper today -use my taper -if not improved go back to med calendar prednisone instructions.  Use Zyrtec 10mg  At bedtime  For 5 days then As needed  Drainage.  Please contact office for sooner follow up if symptoms do not improve or worsen or seek emergency care    02/16/2014 f/u ov/Wert re: copd/ not back to baseline since early May 2015  Chief Complaint  Patient presents with  . Acute Visit    Pt c/o ear pain and prod cough with green sputum- symptoms are no better since last visit with TP on 02/05/14. She is using albuterol in her neb every 4 hours.   does have med calendar and appears to correlate with meds Denies smoking and husband confirms Behavior in office erratic C/o sob x 10 ft, not reproducible C/o constant cough with green mucus, none during interview exam or walk rec Stop tudorza Start spiriva respimat 2 pffs each am Please see patient coordinator before you leave today  to schedule sinus ct See calendar for specific medication instructions   Admit 8/18 = 02/27/14 for aecopd with mild hypercarbia and CTa neg x emphysema   03/17/2014 post hosp f/u/ re-establish  ov/Wert re: copd/f/u denies smoking  Chief Complaint  Patient presents with  . HFU    Pt states recently d/c'ed from [facility] for URI. She states that since she was discharged, her breathing is no better. She is using albuterol inhaler 3-4 times per day and albuterol neb approx 6 times per day.  feels like losing ground again since d/c from [place] and treated as aecopd Rambling hx will not answer specific questions "I know my body"  ? When were you last able to walk comfortably A last summer (2014)  able to walk at mall comfortably s 02.  Cough greenish tint / no better on abx  symbicort empty/ no mucinex  04/19/2014 Follow up and Medication calendar -COPD  Returns for follow up and medication review  We reviewed all her meds and organized them into a medication calendar w/ pt education.  Husband is with her today, she is very tearful and anxious throughout the exam. Very upset that her COPD has gotten this bad . Support provided with pt education .  Does not have symbicort with her today. Says it is at pharmacy their was a mix up with pharmacist.  Caprice Renshaw inhaler is set on 50 doses, this was set on 50 at last ov.  Assures me she is taking and has not missed any doses.  Says she can afford they are only $3 each.  Says she has not smoked and agrees to urine nicotine check today as requested by Dr. Melvyn Novas  . We discussed the importance of smoking cessation.  She denies hemoptysis , chest pain, orthopnea or edema.  Discussed pulmonary rehab and she agrees to referral .   04/30/2014 [facility] follow up  Pt returns for post hospital follow up  Admitted 10/17-20 for COPD flare and hypercarbic RF (PCO2 was 70 on admit )  Tx w/ abx , steroids and nebs.  She is feeling better but still weak.  Had long discussion with pt and husband regarding her disease. She does admit That she smokes occasionally -we discussed dangers of O2 w/ smoking and her diseaase.  Has few days of steroids left. Has finished Levaquin .  Still coughing up thick mucus.  Denies chest pain, orthopnea, edema or hemoptysis.   Current Medications, Allergies, Complete Past Medical History, Past Surgical History, Family History, and Social History were reviewed in [employer] record.  ROS  The following are not active complaints unless bolded sore throat, dysphagia, dental problems, itching, sneezing,   , ear ache,   fever, chills, sweats, unintended wt loss, pleuritic or exertional cp, hemoptysis,   orthopnea pnd or leg swelling, presyncope, palpitations, heartburn, abdominal pain, anorexia, nausea, vomiting, diarrhea  or change in bowel or urinary habits, change in stools or urine, dysuria,hematuria,  rash, arthralgias, visual complaints, headache, numbness weakness or ataxia or problems with walking or coordination,  change in mood/affect or memory.         Objective:   Physical Exam   08/23/2011  Wt 145 >   149 01/24/2012 >  144 03/11/2012 > 04/23/2012  142 > 03/16/2013  130 >04/07/2013 > 132 04/24/13 > 134 05/08/2013 >134 06/29/2013 > 07/22/2013 130 > 08/28/2013 135 >138 10/30/13 >136 12/14/2013 >  12/25/2013 133 >02/05/2014 130 > 02/16/2014   133 > 9?9/15  128 >130 04/19/2014 >121 04/30/2014     NAD    HEENT mild turbinate edema.  Oropharynx clear , very poor dentition, cushingnoid appearance     NECK:  Supple w/ fair ROM; no JVD; normal carotid impulses w/o bruits; no thyromegaly or nodules palpated; no lymphadenopathy.  RESP  Decreased BS in bases  no accessory muscle use, no dullness to percussion  CARD:  RRR, no m/r/g  , no peripheral edema, pulses intact, no cyanosis or clubbing.  GI:   Soft & nt; nml bowel sounds; no organomegaly or masses detected.  Musco: Warm bil, no deformities or joint swelling noted.   Neuro: alert, no focal deficits noted,   Skin: Warm, no lesions or rashes   CXR 04/24/14 COPD changes         Assessment & Plan:

## 2014-04-30 NOTE — Patient Instructions (Signed)
Wear oxygen 2l/m at rest and 3l/m with walking  Taper Prednisone as directed.  Sputum culture today .  Work on no smoking  Follow up in 2 weeks and As needed   Please contact office for sooner follow up if symptoms do not improve or worsen or seek emergency care

## 2014-05-03 LAB — RESPIRATORY CULTURE OR RESPIRATORY AND SPUTUM CULTURE: Organism ID, Bacteria: NORMAL

## 2014-05-03 MED ORDER — ALPRAZOLAM 0.5 MG PO TABS: 0.5000 mg | ORAL_TABLET | Freq: Two times a day (BID) | ORAL | Status: DC

## 2014-05-03 MED ORDER — PREDNISONE 10 MG PO TABS: 10.0000 mg | ORAL_TABLET | Freq: Two times a day (BID) | ORAL | Status: DC

## 2014-05-03 NOTE — Telephone Encounter (Signed)
I am scheduled out 2 months, so it would be better if someone else could see her sooner

## 2014-05-03 NOTE — Telephone Encounter (Signed)
[email or web address]  

## 2014-05-03 NOTE — Telephone Encounter (Signed)
Called and spoke to pt. Pt requesting to switch physicians from Dr. Melvyn Novas to Dr. Annamaria Boots.   Dr. Annamaria Boots please advise.   Pt is also requesting to have her pred refilled. Rx sent to preferred pharmacy. Pt requesting to have her Xanax refilled by TP. Pt stated [facility] normally fills this medication but discussed the refill at her last OV with TP on 10/23.  Last refilled on 03/02/14 for #60 and 0 refills, take 1 q6h prn.  TP please advise if ok to refill Xanax.   Allergies  Allergen Reactions  . Cymbalta [Duloxetine Hcl] Nausea And Vomiting  . Pregabalin Swelling  . Wellbutrin [Bupropion] Other (See Comments)    Pt stated that this was back from the 1990's.  This medication made her very nervous

## 2014-05-03 NOTE — Telephone Encounter (Signed)
lmtcb for pt.  

## 2014-05-03 NOTE — Telephone Encounter (Signed)
Per TP: okay for the Xanax 0.5mg  BID #60, no refills Called [facility] in [place], spoke with pharmacist Alexis Goodell and gave authorization verbally Med list updated  Can inform pt when she returns call

## 2014-05-04 NOTE — Telephone Encounter (Signed)
Called spoke with patient - advised of xanax rx at the pharmacy Also advised pt of CY's response about another provider seeing her sooner Pt would like to think about this and will contact the office once she has made a decision Will hold in triage

## 2014-05-05 NOTE — Telephone Encounter (Signed)
Pt is calling back again [phone]

## 2014-05-05 NOTE — Telephone Encounter (Signed)
Viewing schedules: RB is the only doc w/ openings but not until 12/1 Will call pt this afternoon to discuss

## 2014-05-06 NOTE — Telephone Encounter (Signed)
Called pt appt scheduled. Nothing further needed 

## 2014-05-06 NOTE — Telephone Encounter (Signed)
Spoke with patient-she really wants to see CY and willing to wait until next opening(since no one has openings until 06-08-14); pt aware that I will send to [facility] and see what his decision is. Pt is due for HFU with TP soon.   CY please advise. Thanks.

## 2014-05-06 NOTE — Telephone Encounter (Signed)
Ok with me 

## 2014-05-07 ENCOUNTER — Telehealth (HOSPITAL_COMMUNITY): Payer: Self-pay

## 2014-05-07 NOTE — Telephone Encounter (Signed)
I have called and left a message with Lanisha to inquire about participation in Pulmonary Rehab. Will send letter in mail and follow up.

## 2014-05-10 NOTE — Addendum Note (Signed)
Addended by: Parke Poisson E on: 05/10/2014 01:35 PM   Modules accepted: Orders, Medications

## 2014-05-13 ENCOUNTER — Telehealth (HOSPITAL_COMMUNITY): Payer: Self-pay | Admitting: *Deleted

## 2014-05-14 ENCOUNTER — Telehealth (HOSPITAL_COMMUNITY): Payer: Self-pay

## 2014-05-14 ENCOUNTER — Ambulatory Visit (INDEPENDENT_AMBULATORY_CARE_PROVIDER_SITE_OTHER): Payer: Medicaid Other | Admitting: Adult Health

## 2014-05-14 ENCOUNTER — Encounter: Payer: Self-pay | Admitting: Adult Health

## 2014-05-14 VITALS — BP 104/62 | HR 89 | Temp 97.5°F | Ht 58.5 in | Wt 128.6 lb

## 2014-05-14 DIAGNOSIS — J449 Chronic obstructive pulmonary disease, unspecified: Secondary | ICD-10-CM

## 2014-05-14 MED ORDER — ROFLUMILAST 500 MCG PO TABS: 500.0000 ug | ORAL_TABLET | Freq: Every day | ORAL | Status: DC

## 2014-05-14 NOTE — Telephone Encounter (Signed)
Patient called to enquire about attending the pulmonary rehab exercise program. Patient informed that medicaid would not cover any of the cost of the program. Gave patient option to attend our self pay maintenance exercise program. Patient stated she was on a limited income and would call back if she could incur the cost.

## 2014-05-14 NOTE — Patient Instructions (Signed)
Wear oxygen 2l/m at rest and 3l/m with walking  Taper Prednisone as directed.  Begin Daliresp 500mg  daily  Work on no smoking  Follow up in 4 weeks and As needed   Please contact office for sooner follow up if symptoms do not improve or worsen or seek emergency care

## 2014-05-14 NOTE — Assessment & Plan Note (Signed)
Recurrent exacerbations  Trial of daliresp   Plan  Wear oxygen 2l/m at rest and 3l/m with walking  Taper Prednisone as directed.  Begin Daliresp 500mg  daily  Work on no smoking  Follow up in 4 weeks and As needed   Please contact office for sooner follow up if symptoms do not improve or worsen or seek emergency care

## 2014-05-14 NOTE — Progress Notes (Signed)
Subjective:    Patient ID: [name]    DOB: 12-15-1970    MRN: [id number]   Brief patient profile:  54  yowf smoker used 02 tent as infant and in 4th or 5th grade then did fine thru HS cheerleading and tol ex well then except  much worse sob  assoc with episodes of severe bronchitis then even in between episodes of  Bronchitis developed chronic doe around  2010 referred 08/23/2011 by Dr Arelia Sneddon to pulmonary clinic with GOLD II/III COPD by pft's 11/29/2011    HPI  03/16/2013 f/u ov/Wert stopped all meds March 2014 then downhill since then Chief Complaint  Patient presents with  . Follow-up    Pt states having increased SOB for the past 3 months.  She states has SOB with or without exertion. She has prod cough for the past 2 months-prod with green to yellow sputum.   still smoking but not daily. On acei with sensation of choking/throat irritation /sever cough not much better on aug/pred rx  >d/c ace , PPI /pepcid   04/07/2013 Follow up and med review  She is brought all her medications and her review We reviewed all her medications and organized them into a medication calendar with patient education It appears she is taking meds correctly.   Complains of  prod cough with thick green/yellow mucus, increased SOB, wheezing, chest tightness, hoarseness, some low grade temp x15 days Went to ENT [place] screening last week for hoarseness was  Informed throat was ok. But to come back for more detailed exam.  No hemoptysis, chest pain, orthopnea, edema.  Has lost 10 lbs over last year.  Last ov ACE was stopped , has not seen any change in hoarseness or cough.  Still smoking , cessation discussed.  rec Omnicef 300mg  Twice daily  For 7 days  Prednisone taper over next week.  Must quit smoking .  I will call with chest xray  And lab results.  Brush /rinse and gargle after use  May use Armour/hammer baking soda/peroxide toothpaste  Mucinex DM Twice daily  As needed  Cough/congestion   Follow med calendar closely and bring to each visit      08/28/2013 f/u ov/Wert re: worse cough x 2 months/ still smoking / using med calendar / on 20 mg prednisone Chief Complaint  Patient presents with  . Follow-up    Pt still SOB, prod cough with green mucous.     coughing greeen since Xmas esp in am, some better p saba hfa but insists on using the neb first. >>augmentin x 10 d   10/30/13 Follow up and Med Review  Patient returns for a followup and medication review. Reviewed all her medications organized them into a medication calendar. Patient education. Patient has not smoked, encouraged on continued smoking cessation. Last visit. Patient had a slow to resolve bronchitis/ sinusitis She was treated with Augmentin for 10 days The symptoms are much improved She denies any hemoptysis, chest pain, orthopnea, PND, or leg swelling.  >>med calendar    12/14/2013 Acute OV sick since first week in May  Pt complains of increased SOB, wheezing, hoarseness, prod cough with green/yellow mucus for 6 weeks.  Waxing and waning  With congestion.  No fever for last few weeks.  Saw  PCP at onset and has finished 2 rounds augmentin.  fever at onset.  Last abx finished 3 days ago.  Wheezing is worse this last week.  No hemoptysis, chest pain , abd pain, n/v/d.  Husband getting surgery this week.  Mucinex DM is not helping.  Still smoking  rec Chest xray today  Prednisone taper over next week.  Mucinex DM Twice daily  As needed  Cough/congestion      02/05/2014 Follow up and Med Calendar  Patient returns for a 6 week followup and medication review. We reviewed all her medications organized them into a medication calendar with patient education. Appears that she is taking her medications correctly. Patient denies any hemoptysis, orthopnea, PND, or leg swelling. Says she still does not feel well , continues to cough up congestion w/ thick green mucus.  Complains of increased SOB, wheezing,  prod cough with green mucus, tightness since last ov.  Complains of left ear fullness . Was started on Prednisone last visit but has not been able to taper off, remains on Prednisone 20mg  daily  rec Levaquin 500mg  daily for 10 days -take with food Eat yogurt , take probiotic while on antibiotics Prednisone taper today -use my taper -if not improved go back to med calendar prednisone instructions.  Use Zyrtec 10mg  At bedtime  For 5 days then As needed  Drainage.  Please contact office for sooner follow up if symptoms do not improve or worsen or seek emergency care    02/16/2014 f/u ov/Wert re: copd/ not back to baseline since early May 2015  Chief Complaint  Patient presents with  . Acute Visit    Pt c/o ear pain and prod cough with green sputum- symptoms are no better since last visit with TP on 02/05/14. She is using albuterol in her neb every 4 hours.   does have med calendar and appears to correlate with meds Denies smoking and husband confirms Behavior in office erratic C/o sob x 10 ft, not reproducible C/o constant cough with green mucus, none during interview exam or walk rec Stop tudorza Start spiriva respimat 2 pffs each am Please see patient coordinator before you leave today  to schedule sinus ct See calendar for specific medication instructions   Admit 8/18 = 02/27/14 for aecopd with mild hypercarbia and CTa neg x emphysema   03/17/2014 post hosp f/u/ re-establish  ov/Wert re: copd/f/u denies smoking  Chief Complaint  Patient presents with  . HFU    Pt states recently d/c'ed from [facility] for URI. She states that since she was discharged, her breathing is no better. She is using albuterol inhaler 3-4 times per day and albuterol neb approx 6 times per day.  feels like losing ground again since d/c from [place] and treated as aecopd Rambling hx will not answer specific questions "I know my body"  ? When were you last able to walk comfortably A last summer (2014)  able to walk at mall comfortably s 02.  Cough greenish tint / no better on abx  symbicort empty/ no mucinex  04/19/2014 Follow up and Medication calendar -COPD  Returns for follow up and medication review  We reviewed all her meds and organized them into a medication calendar w/ pt education.  Husband is with her today, she is very tearful and anxious throughout the exam. Very upset that her COPD has gotten this bad . Support provided with pt education .  Does not have symbicort with her today. Says it is at pharmacy their was a mix up with pharmacist.  Caprice Renshaw inhaler is set on 50 doses, this was set on 50 at last ov.  Assures me she is taking and has not missed any doses.  Says she can afford they are only $3 each.  Says she has not smoked and agrees to urine nicotine check today as requested by Dr. Melvyn Novas  . We discussed the importance of smoking cessation.  She denies hemoptysis , chest pain, orthopnea or edema.  Discussed pulmonary rehab and she agrees to referral .   04/30/2014 [facility] follow up  Pt returns for post hospital follow up  Admitted 10/17-20 for COPD flare and hypercarbic RF (PCO2 was 70 on admit )  Tx w/ abx , steroids and nebs.  She is feeling better but still weak.  Had long discussion with pt and husband regarding her disease. She does admit That she smokes occasionally -we discussed dangers of O2 w/ smoking and her diseaase.  Has few days of steroids left. Has finished Levaquin .  Still coughing up thick mucus.  Denies chest pain, orthopnea, edema or hemoptysis.  >>pred taper     05/14/2014 Follow up (COPD /Hypercarbic/Hypoxic RF )  Returns for 2 week follow up for COPD .  Overall feels she is improved, breathing is returning to baseline Remains on prednisone 10mg  , 2 tabs daily . She has been referred to pulmonary rehab, waiting to hear back from them.  Cough and congestion are some better.  Has drainage in throat on/off with some hoarseness.  Denies  chest pain, orthopnea, edema or n/v.       Current Medications, Allergies, Complete Past Medical History, Past Surgical History, Family History, and Social History were reviewed in [employer] record.  ROS  The following are not active complaints unless bolded sore throat, dysphagia, dental problems, itching, sneezing,   , ear ache,   fever, chills, sweats, unintended wt loss, pleuritic or exertional cp, hemoptysis,  orthopnea pnd or leg swelling, presyncope, palpitations, heartburn, abdominal pain, anorexia, nausea, vomiting, diarrhea  or change in bowel or urinary habits, change in stools or urine, dysuria,hematuria,  rash, arthralgias, visual complaints, headache, numbness weakness or ataxia or problems with walking or coordination,  change in mood/affect or memory.         Objective:   Physical Exam   08/23/2011  Wt 145 >   149 01/24/2012 >  144 03/11/2012 > 04/23/2012  142 > 03/16/2013  130 >04/07/2013 > 132 04/24/13 > 134 05/08/2013 >134 06/29/2013 > 07/22/2013 130 > 08/28/2013 135 >138 10/30/13 >136 12/14/2013 >  12/25/2013 133 >02/05/2014 130 > 02/16/2014   133 > 9?9/15  128 >130 04/19/2014 >121 04/30/2014 >128 05/14/2014     NAD    HEENT mild turbinate edema.  Oropharynx clear , very poor dentition, cushingnoid appearance     NECK:  Supple w/ fair ROM; no JVD; normal carotid impulses w/o bruits; no thyromegaly or nodules palpated; no lymphadenopathy.  RESP  Decreased BS in bases  no accessory muscle use, no dullness to percussion  CARD:  RRR, no m/r/g  , no peripheral edema, pulses intact, no cyanosis or clubbing.  GI:   Soft & nt; nml bowel sounds; no organomegaly or masses detected.  Musco: Warm bil, no deformities or joint swelling noted.   Neuro: alert, no focal deficits noted,   Skin: Warm, no lesions or rashes   CXR 04/24/14 COPD changes         Assessment & Plan:

## 2014-05-16 ENCOUNTER — Other Ambulatory Visit: Payer: Self-pay | Admitting: Internal Medicine

## 2014-05-17 ENCOUNTER — Telehealth: Payer: Self-pay | Admitting: Internal Medicine

## 2014-05-17 ENCOUNTER — Telehealth: Payer: Self-pay | Admitting: Adult Health

## 2014-05-17 MED ORDER — ACLIDINIUM BROMIDE 400 MCG/ACT IN AEPB: 2.0000 | INHALATION_SPRAY | Freq: Every day | RESPIRATORY_TRACT | Status: DC

## 2014-05-17 NOTE — Telephone Encounter (Signed)
Call from [place]  [phone]-- Caprice Renshaw needs PA  Called pt, states that Caprice Renshaw is requiring PA, needing a sample until approved by insurance. Placed up front for pick up. Needing PA on Daliresp as well.   Called [employer] for Medicaid PA for Margarito Liner Patient ID [id number] L Approved x 1 year (05/18/2015) Pt aware.  Confirm # Caprice Renshaw [id number] Confrim # [id number] [id number]  Nothing further needed.

## 2014-05-17 NOTE — Telephone Encounter (Signed)
Duplicate message. This message to be closed-- see 05/17/14 "wants samples & Medicaid req prior auth"

## 2014-06-11 ENCOUNTER — Ambulatory Visit: Payer: Medicaid Other | Admitting: Adult Health

## 2014-06-14 ENCOUNTER — Ambulatory Visit: Payer: Medicaid Other | Admitting: Adult Health

## 2014-06-17 ENCOUNTER — Other Ambulatory Visit (INDEPENDENT_AMBULATORY_CARE_PROVIDER_SITE_OTHER): Payer: Medicaid Other

## 2014-06-17 ENCOUNTER — Encounter: Payer: Self-pay | Admitting: Adult Health

## 2014-06-17 ENCOUNTER — Ambulatory Visit (INDEPENDENT_AMBULATORY_CARE_PROVIDER_SITE_OTHER): Payer: Medicaid Other | Admitting: Adult Health

## 2014-06-17 VITALS — BP 120/74 | HR 124 | Temp 98.0°F | Ht <= 58 in | Wt 124.4 lb

## 2014-06-17 DIAGNOSIS — J449 Chronic obstructive pulmonary disease, unspecified: Secondary | ICD-10-CM

## 2014-06-17 LAB — BASIC METABOLIC PANEL
BUN: 13 mg/dL (ref 6–23)
CHLORIDE: 85 meq/L — AB (ref 96–112)
CO2: 33 meq/L — AB (ref 19–32)
CREATININE: 0.9 mg/dL (ref 0.4–1.2)
Calcium: 9.3 mg/dL (ref 8.4–10.5)
GFR: 72.79 mL/min (ref >60.00)
Glucose, Bld: 174 mg/dL — ABNORMAL HIGH (ref 70–99)
POTASSIUM: 3.4 meq/L — AB (ref 3.5–5.1)
Sodium: 131 mEq/L — ABNORMAL LOW (ref 135–145)

## 2014-06-17 MED ORDER — CLOTRIMAZOLE 10 MG MT TROC: 10.0000 mg | Freq: Every day | OROMUCOSAL | Status: DC

## 2014-06-17 NOTE — Progress Notes (Signed)
Subjective:    Patient ID: [name]    DOB: 12-15-1970    MRN: [id number]   Brief patient profile:  54  yowf smoker used 02 tent as infant and in 4th or 5th grade then did fine thru HS cheerleading and tol ex well then except  much worse sob  assoc with episodes of severe bronchitis then even in between episodes of  Bronchitis developed chronic doe around  2010 referred 08/23/2011 by Dr Arelia Sneddon to pulmonary clinic with GOLD II/III COPD by pft's 11/29/2011    HPI  03/16/2013 f/u ov/Wert stopped all meds March 2014 then downhill since then Chief Complaint  Patient presents with  . Follow-up    Pt states having increased SOB for the past 3 months.  She states has SOB with or without exertion. She has prod cough for the past 2 months-prod with green to yellow sputum.   still smoking but not daily. On acei with sensation of choking/throat irritation /sever cough not much better on aug/pred rx  >d/c ace , PPI /pepcid   04/07/2013 Follow up and med review  She is brought all her medications and her review We reviewed all her medications and organized them into a medication calendar with patient education It appears she is taking meds correctly.   Complains of  prod cough with thick green/yellow mucus, increased SOB, wheezing, chest tightness, hoarseness, some low grade temp x15 days Went to ENT [place] screening last week for hoarseness was  Informed throat was ok. But to come back for more detailed exam.  No hemoptysis, chest pain, orthopnea, edema.  Has lost 10 lbs over last year.  Last ov ACE was stopped , has not seen any change in hoarseness or cough.  Still smoking , cessation discussed.  rec Omnicef 300mg  Twice daily  For 7 days  Prednisone taper over next week.  Must quit smoking .  I will call with chest xray  And lab results.  Brush /rinse and gargle after use  May use Armour/hammer baking soda/peroxide toothpaste  Mucinex DM Twice daily  As needed  Cough/congestion   Follow med calendar closely and bring to each visit      08/28/2013 f/u ov/Wert re: worse cough x 2 months/ still smoking / using med calendar / on 20 mg prednisone Chief Complaint  Patient presents with  . Follow-up    Pt still SOB, prod cough with green mucous.     coughing greeen since Xmas esp in am, some better p saba hfa but insists on using the neb first. >>augmentin x 10 d   10/30/13 Follow up and Med Review  Patient returns for a followup and medication review. Reviewed all her medications organized them into a medication calendar. Patient education. Patient has not smoked, encouraged on continued smoking cessation. Last visit. Patient had a slow to resolve bronchitis/ sinusitis She was treated with Augmentin for 10 days The symptoms are much improved She denies any hemoptysis, chest pain, orthopnea, PND, or leg swelling.  >>med calendar    12/14/2013 Acute OV sick since first week in May  Pt complains of increased SOB, wheezing, hoarseness, prod cough with green/yellow mucus for 6 weeks.  Waxing and waning  With congestion.  No fever for last few weeks.  Saw  PCP at onset and has finished 2 rounds augmentin.  fever at onset.  Last abx finished 3 days ago.  Wheezing is worse this last week.  No hemoptysis, chest pain , abd pain, n/v/d.  Husband getting surgery this week.  Mucinex DM is not helping.  Still smoking  rec Chest xray today  Prednisone taper over next week.  Mucinex DM Twice daily  As needed  Cough/congestion      02/05/2014 Follow up and Med Calendar  Patient returns for a 6 week followup and medication review. We reviewed all her medications organized them into a medication calendar with patient education. Appears that she is taking her medications correctly. Patient denies any hemoptysis, orthopnea, PND, or leg swelling. Says she still does not feel well , continues to cough up congestion w/ thick green mucus.  Complains of increased SOB, wheezing,  prod cough with green mucus, tightness since last ov.  Complains of left ear fullness . Was started on Prednisone last visit but has not been able to taper off, remains on Prednisone 20mg  daily  rec Levaquin 500mg  daily for 10 days -take with food Eat yogurt , take probiotic while on antibiotics Prednisone taper today -use my taper -if not improved go back to med calendar prednisone instructions.  Use Zyrtec 10mg  At bedtime  For 5 days then As needed  Drainage.  Please contact office for sooner follow up if symptoms do not improve or worsen or seek emergency care    02/16/2014 f/u ov/Wert re: copd/ not back to baseline since early May 2015  Chief Complaint  Patient presents with  . Acute Visit    Pt c/o ear pain and prod cough with green sputum- symptoms are no better since last visit with TP on 02/05/14. She is using albuterol in her neb every 4 hours.   does have med calendar and appears to correlate with meds Denies smoking and husband confirms Behavior in office erratic C/o sob x 10 ft, not reproducible C/o constant cough with green mucus, none during interview exam or walk rec Stop tudorza Start spiriva respimat 2 pffs each am Please see patient coordinator before you leave today  to schedule sinus ct See calendar for specific medication instructions   Admit 8/18 = 02/27/14 for aecopd with mild hypercarbia and CTa neg x emphysema   03/17/2014 post hosp f/u/ re-establish  ov/Wert re: copd/f/u denies smoking  Chief Complaint  Patient presents with  . HFU    Pt states recently d/c'ed from [facility] for URI. She states that since she was discharged, her breathing is no better. She is using albuterol inhaler 3-4 times per day and albuterol neb approx 6 times per day.  feels like losing ground again since d/c from [place] and treated as aecopd [place] hx will not answer specific questions "I know my body"  ? When were you last able to walk comfortably A last summer (2014)  able to walk at mall comfortably s 02.  Cough greenish tint / no better on abx  symbicort empty/ no mucinex  04/19/2014 Follow up and Medication calendar -COPD  Returns for follow up and medication review  We reviewed all her meds and organized them into a medication calendar w/ pt education.  Husband is with her today, she is very tearful and anxious throughout the exam. Very upset that her COPD has gotten this bad . Support provided with pt education .  Does not have symbicort with her today. Says it is at pharmacy their was a mix up with pharmacist.  Caprice Renshaw inhaler is set on 50 doses, this was set on 50 at last ov.  Assures me she is taking and has not missed any doses.  Says she can afford they are only $3 each.  Says she has not smoked and agrees to urine nicotine check today as requested by Dr. Melvyn Novas  . We discussed the importance of smoking cessation.  She denies hemoptysis , chest pain, orthopnea or edema.  Discussed pulmonary rehab and she agrees to referral .   04/30/2014 [facility] follow up  Pt returns for post hospital follow up  Admitted 10/17-20 for COPD flare and hypercarbic RF (PCO2 was 70 on admit )  Tx w/ abx , steroids and nebs.  She is feeling better but still weak.  Had long discussion with pt and husband regarding her disease. She does admit That she smokes occasionally -we discussed dangers of O2 w/ smoking and her diseaase.  Has few days of steroids left. Has finished Levaquin .  Still coughing up thick mucus.  Denies chest pain, orthopnea, edema or hemoptysis.  >>pred taper     05/14/2014 Follow up (COPD /Hypercarbic/Hypoxic RF )  Returns for 2 week follow up for COPD .  Overall feels she is improved, breathing is returning to baseline Remains on prednisone 10mg  , 2 tabs daily . She has been referred to pulmonary rehab, waiting to hear back from them.  Cough and congestion are some better.  Has drainage in throat on/off with some hoarseness.  Denies  chest pain, orthopnea, edema or n/v.  >>taper prednisone , daliresp trial   06/17/2014 Follow up (COPD /Hypercarbic/Hypoxic RF )  Returns  4 week follow up -  Reports still having SOB walking and at rest. Under more stress, grandson born premature, at [employer].  Got 2 teeth pulled.  On prednisone 10mg  2 tabs daily  More anxious -took xanax with some help.  No fever, discolored mucus, chest pain, orthopnea or edema.  Tried on Daliresp last ov ,but did not like it, made her feel bad.  Admits smoked 2 draws off cigarette after death of friends son.  Requests help in the home , too weak to take a shower at times.  We discussed pulmonary rehab again .        Current Medications, Allergies, Complete Past Medical History, Past Surgical History, Family History, and Social History were reviewed in [employer] record.  ROS  The following are not active complaints unless bolded sore throat, dysphagia, dental problems, itching, sneezing,   , ear ache,   fever, chills, sweats, unintended wt loss, pleuritic or exertional cp, hemoptysis,  orthopnea pnd or leg swelling, presyncope, palpitations, heartburn, abdominal pain, anorexia, nausea, vomiting, diarrhea  or change in bowel or urinary habits, change in stools or urine, dysuria,hematuria,  rash, arthralgias, visual complaints, headache, numbness weakness or ataxia or problems with walking or coordination,  change in mood/affect or memory.         Objective:   Physical Exam   08/23/2011  Wt 145 >   149 01/24/2012 >  144 03/11/2012 > 04/23/2012  142 > 03/16/2013  130 >04/07/2013 > 132 04/24/13 > 134 05/08/2013 >134 06/29/2013 > 07/22/2013 130 > 08/28/2013 135 >138 10/30/13 >136 12/14/2013 >  12/25/2013 133 >02/05/2014 130 > 02/16/2014   133 > 9?9/15  128 >130 04/19/2014 >121 04/30/2014 >128 05/14/2014 >124 06/17/2014     NAD    HEENT mild turbinate edema.  Oropharynx w/ few scattered white patches  , very poor dentition, cushingnoid  appearance ,      NECK:  Supple w/ fair ROM; no JVD; normal carotid impulses w/o bruits; no thyromegaly or  nodules palpated; no lymphadenopathy.  RESP  Decreased BS in bases  no accessory muscle use, no dullness to percussion,   CARD:  RRR, no m/r/g  , no peripheral edema, pulses intact, no cyanosis or clubbing.  GI:   Soft & nt; nml bowel sounds; no organomegaly or masses detected.  Musco: Warm bil, no deformities or joint swelling noted.   Neuro: alert, no focal deficits noted,   Skin: Warm, no lesions or rashes   CXR 04/24/14 COPD changes         Assessment & Plan:

## 2014-06-17 NOTE — Patient Instructions (Addendum)
Wear oxygen 2l/m at rest and 3l/m with walking  Mycelex five times daily for 1 week.  Follow up in 4 weeks and As needed   Labs today .  Please contact office for sooner follow up if symptoms do not improve or worsen or seek emergency care

## 2014-06-17 NOTE — Assessment & Plan Note (Signed)
Severe COPD-steroid and O2 dependent  Anxiety a contributing factor to her dyspnea.  HH referral for evaluation for in home assistance .  Oral candidiasis noted on exam today  Check labs to see if HCO3 is rising   Plan  Wear oxygen 2l/m at rest and 3l/m with walking  Mycelex five times daily for 1 week.  Follow up in 4 weeks and As needed   Labs today .  Please contact office for sooner follow up if symptoms do not improve or worsen or seek emergency care

## 2014-06-17 NOTE — Addendum Note (Signed)
Addended by: Parke Poisson E on: 06/17/2014 05:20 PM   Modules accepted: Orders, Medications

## 2014-06-18 ENCOUNTER — Telehealth: Payer: Self-pay | Admitting: Adult Health

## 2014-06-18 NOTE — Telephone Encounter (Signed)
Lab results:  Notes Recorded by Melvenia Needles, NP on 06/17/2014 at 4:45 PM Fax to PCP , BS tr up , not fasting  K+ slighlty low , eat banans , follow up with PCP  CO2 level is less  Cont with ov rec s Please contact office for sooner follow up if symptoms do not improve or worsen or seek emergency care   Pt advised of results and results faxed to Dr. Arelia Sneddon. [place], CMA

## 2014-06-24 NOTE — Progress Notes (Signed)
Quick Note:  Called spoke with patient's spouse. Advised of lab results / recs as stated by TP. Spouse verbalized understanding and denied any questions. Labs faxed to PCP via [employer]. ______

## 2014-06-25 ENCOUNTER — Telehealth: Payer: Self-pay | Admitting: Adult Health

## 2014-06-25 NOTE — Telephone Encounter (Signed)
Spoke with June and notified ok for 2 additional wks of PT  Nothing further needed

## 2014-07-12 ENCOUNTER — Ambulatory Visit (INDEPENDENT_AMBULATORY_CARE_PROVIDER_SITE_OTHER): Payer: Medicaid Other | Admitting: Internal Medicine

## 2014-07-12 ENCOUNTER — Encounter: Payer: Self-pay | Admitting: Internal Medicine

## 2014-07-12 VITALS — BP 118/80 | HR 89 | Ht <= 58 in | Wt 128.0 lb

## 2014-07-12 DIAGNOSIS — Z92241 Personal history of systemic steroid therapy: Secondary | ICD-10-CM

## 2014-07-12 DIAGNOSIS — Z720 Tobacco use: Secondary | ICD-10-CM

## 2014-07-12 DIAGNOSIS — J449 Chronic obstructive pulmonary disease, unspecified: Secondary | ICD-10-CM

## 2014-07-12 DIAGNOSIS — F411 Generalized anxiety disorder: Secondary | ICD-10-CM

## 2014-07-12 DIAGNOSIS — J9612 Chronic respiratory failure with hypercapnia: Secondary | ICD-10-CM

## 2014-07-12 MED ORDER — THEOPHYLLINE ER 100 MG PO TB12: 100.0000 mg | ORAL_TABLET | Freq: Two times a day (BID) | ORAL | Status: DC

## 2014-07-12 NOTE — Patient Instructions (Addendum)
No smoking PLEASE !  Reduce prednisone to 10 mg daily  Try adding theophylline- script sent. Take this with food- breakfast and supper.  Order- Dexa scan bone density    Dx chronic steroid therapy

## 2014-07-12 NOTE — Progress Notes (Signed)
Subjective:    Patient ID: [name]    DOB: [birth year]    MRN: [id number]   Brief patient profile:  17  yowf smoker used 02 tent as infant and in 4th or 5th grade then did fine thru HS cheerleading and tol ex well then except  much worse sob  assoc with episodes of severe bronchitis then even in between episodes of  Bronchitis developed chronic doe around  2010 referred 08/23/2011 by Dr Arelia Sneddon to pulmonary clinic with GOLD II/III COPD by pft's 11/29/2011    HPI  03/16/2013 f/u ov/Wert stopped all meds March 2014 then downhill since then Chief Complaint  Patient presents with  . Follow-up    Pt states having increased SOB for the past 3 months.  She states has SOB with or without exertion. She has prod cough for the past 2 months-prod with green to yellow sputum.   still smoking but not daily. On acei with sensation of choking/throat irritation /sever cough not much better on aug/pred rx  >d/c ace , PPI /pepcid   04/07/2013 Follow up and med review  She is brought all her medications and her review We reviewed all her medications and organized them into a medication calendar with patient education It appears she is taking meds correctly.   Complains of  prod cough with thick green/yellow mucus, increased SOB, wheezing, chest tightness, hoarseness, some low grade temp x15 days Went to [facility] [place] screening last week for hoarseness was  Informed throat was ok. But to come back for more detailed exam.  No hemoptysis, chest pain, orthopnea, edema.  Has lost 10 lbs over last year.  Last ov ACE was stopped , has not seen any change in hoarseness or cough.  Still smoking , cessation discussed.  rec Omnicef 300mg  Twice daily  For 7 days  Prednisone taper over next week.  Must quit smoking .  I will call with chest xray  And lab results.  Brush /rinse and gargle after use  May use Armour/hammer baking soda/peroxide toothpaste  Mucinex DM Twice daily  As needed  Cough/congestion   Follow med calendar closely and bring to each visit      08/28/2013 f/u ov/Wert re: worse cough x 2 months/ still smoking / using med calendar / on 20 mg prednisone Chief Complaint  Patient presents with  . Follow-up    Pt still SOB, prod cough with green mucous.     coughing greeen since Xmas esp in am, some better p saba hfa but insists on using the neb first. >>augmentin x 10 d   10/30/13 Follow up and Med Review  Patient returns for a followup and medication review. Reviewed all her medications organized them into a medication calendar. Patient education. Patient has not smoked, encouraged on continued smoking cessation. Last visit. Patient had a slow to resolve bronchitis/ sinusitis She was treated with Augmentin for 10 days The symptoms are much improved She denies any hemoptysis, chest pain, orthopnea, PND, or leg swelling.  >>med calendar    12/14/2013 Acute OV sick since first week in May  Pt complains of increased SOB, wheezing, hoarseness, prod cough with green/yellow mucus for 6 weeks.  Waxing and waning  With congestion.  No fever for last few weeks.  Saw  PCP at onset and has finished 2 rounds augmentin.  fever at onset.  Last abx finished 3 days ago.  Wheezing is worse this last week.  No hemoptysis, chest pain , abd pain, n/v/d.  Husband getting surgery this week.  Mucinex DM is not helping.  Still smoking  rec Chest xray today  Prednisone taper over next week.  Mucinex DM Twice daily  As needed  Cough/congestion   02/05/2014 Follow up and Med Calendar  Patient returns for a 6 week followup and medication review. We reviewed all her medications organized them into a medication calendar with patient education. Appears that she is taking her medications correctly. Patient denies any hemoptysis, orthopnea, PND, or leg swelling. Says she still does not feel well , continues to cough up congestion w/ thick green mucus.  Complains of increased SOB, wheezing, prod  cough with green mucus, tightness since last ov.  Complains of left ear fullness . Was started on Prednisone last visit but has not been able to taper off, remains on Prednisone 20mg  daily  rec Levaquin 500mg  daily for 10 days -take with food Eat yogurt , take probiotic while on antibiotics Prednisone taper today -use my taper -if not improved go back to med calendar prednisone instructions.  Use Zyrtec 10mg  At bedtime  For 5 days then As needed  Drainage.  Please contact office for sooner follow up if symptoms do not improve or worsen or seek emergency care    02/16/2014 f/u ov/Wert re: copd/ not back to baseline since early May 2015  Chief Complaint  Patient presents with  . Acute Visit    Pt c/o ear pain and prod cough with green sputum- symptoms are no better since last visit with TP on 02/05/14. She is using albuterol in her neb every 4 hours.   does have med calendar and appears to correlate with meds Denies smoking and husband confirms Behavior in office erratic C/o sob x 10 ft, not reproducible C/o constant cough with green mucus, none during interview exam or walk rec Stop tudorza Start spiriva respimat 2 pffs each am Please see patient coordinator before you leave today  to schedule sinus ct See calendar for specific medication instructions   Admit 8/18 = 02/27/14 for aecopd with mild hypercarbia and CTa neg x emphysema   03/17/2014 post hosp f/u/ re-establish  ov/Wert re: copd/f/u denies smoking  Chief Complaint  Patient presents with  . HFU    Pt states recently d/c'ed from [facility] for URI. She states that since she was discharged, her breathing is no better. She is using albuterol inhaler 3-4 times per day and albuterol neb approx 6 times per day.  feels like losing ground again since d/c from [place] and treated as aecopd Rambling hx will not answer specific questions "I know my body"  ? When were you last able to walk comfortably A last summer (2014) able to  walk at mall comfortably s 02.  Cough greenish tint / no better on abx  symbicort empty/ no mucinex  04/19/2014 Follow up and Medication calendar -COPD  Returns for follow up and medication review  We reviewed all her meds and organized them into a medication calendar w/ pt education.  Husband is with her today, she is very tearful and anxious throughout the exam. Very upset that her COPD has gotten this bad . Support provided with pt education .  Does not have symbicort with her today. Says it is at pharmacy their was a mix up with pharmacist.  [name] inhaler is set on 50 doses, this was set on 50 at last ov.  Assures me she is taking and has not missed any doses.  Says she can  afford they are only $3 each.  Says she has not smoked and agrees to urine nicotine check today as requested by Dr. Melvyn Novas  . We discussed the importance of smoking cessation.  She denies hemoptysis , chest pain, orthopnea or edema.  Discussed pulmonary rehab and she agrees to referral .   04/30/2014 [facility] follow up  Pt returns for post hospital follow up  Admitted 10/17-20 for COPD flare and hypercarbic RF (PCO2 was 70 on admit )  Tx w/ abx , steroids and nebs.  She is feeling better but still weak.  Had long discussion with pt and husband regarding her disease. She does admit That she smokes occasionally -we discussed dangers of O2 w/ smoking and her diseaase.  Has few days of steroids left. Has finished Levaquin .  Still coughing up thick mucus.  Denies chest pain, orthopnea, edema or hemoptysis.  >>pred taper     05/14/2014 Follow up (COPD /Hypercarbic/Hypoxic RF )  Returns for 2 week follow up for COPD .  Overall feels she is improved, breathing is returning to baseline Remains on prednisone 10mg  , 2 tabs daily . She has been referred to pulmonary rehab, waiting to hear back from them.  Cough and congestion are some better.  Has drainage in throat on/off with some hoarseness.  Denies chest pain,  orthopnea, edema or n/v.  >>taper prednisone , daliresp trial   06/17/2014 Follow up (COPD /Hypercarbic/Hypoxic RF )  Returns  4 week follow up -  Reports still having SOB walking and at rest. Under more stress, grandson born premature, at [employer].  Got 2 teeth pulled.  On prednisone 10mg  2 tabs daily  More anxious -took xanax with some help.  No fever, discolored mucus, chest pain, orthopnea or edema.  Tried on Daliresp last ov ,but did not like it, made her feel bad.  Admits smoked 2 draws off cigarette after death of friends son.  Requests help in the home , too weak to take a shower at times.  We discussed pulmonary rehab again .   07/12/14- 2 yoF transferring to me from Dr Melvyn Novas.  Hx of COPD /Hypercarbic/Hypoxic RF  GOLD III. O2 tent as infant. Chronic smoker. Husband here. Finding [name] a big help and continues Symbicort, nebulizer/metered inhaler. 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 Chronic resting tachycardia related to medications without other specific heart disease. She thinks COPD has progressed especially in the last year, beginning around an interval of acute bronchitis in May. Hospitalized twice at [facility]. She is waiting on approval for pulmonary rehabilitation program at [place].  Has been using home oxygen 2-3 liters/[facility]. Says she has not smoked in 11 days. Not much routine phlegm. Has been wheezing more in the last 6 months Chronic anxiety-made a point of telling me how she depends on her Xanax Up-to-date on flu and pneumococcal vaccine PFT 03/26/2014([facility])-very severe obstructive airways disease, hyperinflation, diffusion severely reduced, emphysema pattern with some response to bronchodilator. FVC 2.17/116%, FEV1 0.71/41%, FEV1/FVC 1.31/69%, TLC 213%, DLCO 30% a1AT- MM  Prior to Admission medications   Medication Sig Start Date End Date Taking? Authorizing Provider  albuterol  (PROAIR HFA) 108 (90 BASE) MCG/ACT inhaler Inhale 2 puffs into the lungs every 4 (four) hours as needed for wheezing or shortness of breath (((PLAN B))).    Yes Historical Provider, MD  albuterol (PROVENTIL) (2.5 MG/3ML) 0.083% nebulizer solution Take 2.5 mg by nebulization every 4 (four)  hours as needed for wheezing or shortness of breath.   Yes Historical Provider, MD  ALPRAZolam Duanne Moron) 0.5 MG tablet Take 0.5 mg by mouth every 6 (six) hours as needed for anxiety.   Yes Historical Provider, MD  amitriptyline (ELAVIL) 50 MG tablet Take 50 mg by mouth at bedtime.   Yes Historical Provider, MD  budesonide-formoterol (SYMBICORT) 160-4.5 MCG/ACT inhaler Inhale 2 puffs into the lungs 2 (two) times daily. (((PLAN A)))   Yes Historical Provider, MD  cetirizine (ZYRTEC) 10 MG tablet Take 10 mg by mouth at bedtime as needed for allergies.   Yes Historical Provider, MD  dexlansoprazole (DEXILANT) 60 MG capsule Take 60 mg by mouth daily.   Yes Historical Provider, MD  dextromethorphan-guaiFENesin (MUCINEX DM) 30-600 MG per 12 hr tablet Take 1-2 tablets by mouth every 12 (twelve) hours as needed (w/ flutter).    Yes Historical Provider, MD  famotidine (PEPCID) 20 MG tablet Take 20 mg by mouth daily.   Yes Historical Provider, MD  fentaNYL (DURAGESIC - DOSED MCG/HR) 100 MCG/HR Place 100 mcg onto the skin every other day.   Yes Historical Provider, MD  furosemide (LASIX) 80 MG tablet 1/2 tab by mouth daily as needed   Yes Historical Provider, MD  morphine (MSIR) 30 MG tablet Take 30 mg by mouth 4 (four) times daily. May add 1 every 4 hours as needed for severe pain   Yes Historical Provider, MD  TUDORZA PRESSAIR 400 MCG/ACT AEPB INHALE 1 PUFF INTO THE LUNGS 2 TIMES DAILY. 05/17/14  Yes Tanda Rockers, MD  theophylline (THEODUR) 100 MG 12 hr tablet Take 1 tablet (100 mg total) by mouth 2 (two) times daily. 07/12/14   Deneise Lever, MD   Past Medical History  Diagnosis Date  . Arachnoid cyst   . Tumor, thyroid    . COPD (chronic obstructive pulmonary disease)   . GERD (gastroesophageal reflux disease)   . Hypertension    Past Surgical History  Procedure Laterality Date  . Back surgery  2006  . Tubal ligation  2001   Family History  Problem Relation Age of Onset  . Emphysema Maternal Grandfather     was a smoker  . Allergies Son   . Allergies Daughter   . Asthma Son     as a [place] child  . Asthma Maternal Grandfather   . Asthma Maternal Aunt   . Heart disease Mother   . Heart disease Mother   . Heart disease Paternal Grandmother   . Uterine cancer Mother   . Colon cancer Maternal Grandmother   . Breast cancer Paternal Grandmother    History   Social History  . Marital Status: Married    Spouse Name: N/A    Number of Children: 3  . Years of Education: N/A   Occupational History  . Disabled    Social History Main Topics  . Smoking status: Former Smoker -- 0.25 packs/day for 25 years    Types: Cigarettes    Quit date: 08/21/2013  . Smokeless tobacco: Never Used     Comment: Pt has not smoked in the past week due to her feeling ill 08/28/13  . Alcohol Use: No  . Drug Use: No  . Sexual Activity: Not on file   Other Topics Concern  . Not on file   Social History Narrative   ROS-see HPI Constitutional:   No-   weight loss, night sweats, fevers, chills, fatigue, lassitude. HEENT:   No-  headaches, difficulty swallowing,  tooth/dental problems, sore throat,       No-  sneezing, itching, ear ache, nasal congestion, post nasal drip,  CV:  + chest pain, No-orthopnea, PND, swelling in lower extremities, anasarca,                                  dizziness, palpitations Resp: +shortness of breath with exertion or at rest.              + productive cough,  + non-productive cough,  No- coughing up of blood.              No-   change in color of mucus.  No- wheezing.   Skin: No-   rash or lesions. GI:  No-   heartburn, indigestion, abdominal pain, nausea, vomiting, diarrhea,                  change in bowel habits, loss of appetite GU: No-   dysuria, change in color of urine, no urgency or frequency.  No- flank pain. MS:  No-   joint pain or swelling.  No- decreased range of motion.  No- back pain. Neuro-     nothing unusual Psych:  No- change in mood or affect. No depression + anxiety.  No memory loss.  OBJ- Physical Exam  O2 2L General- Alert, Oriented, Affect-appropriate, Distress- none acute, +talkative Skin- rash-none, lesions- none, excoriation- none Lymphadenopathy- none Head- atraumatic            Eyes- Gross vision intact, PERRLA, conjunctivae and secretions clear            Ears- Hearing, canals-normal            Nose- Clear, no-Septal dev, mucus, polyps, erosion, perforation             Throat- Mallampati II , mucosa clear , drainage- none, tonsils- atrophic Neck- flexible , trachea midline, no stridor , thyroid nl, carotid no bruit Chest - symmetrical excursion , unlabored           Heart/CV- RRR , no murmur , no gallop  , no rub, nl s1 s2                           - JVD- none , edema- none, stasis changes- none, varices- none           Lung- distant, clear to P&A, wheeze- none, cough- none , dullness-none, rub- none           Chest wall-  Abd- tender-no, distended-no, bowel sounds-present, HSM- no, +obese Br/ Gen/ Rectal- Not done, not indicated Extrem- cyanosis- none, clubbing, none, atrophy- none, strength- nl Neuro- grossly intact to observation  Assessment & Plan:

## 2014-07-13 DIAGNOSIS — F411 Generalized anxiety disorder: Secondary | ICD-10-CM | POA: Insufficient documentation

## 2014-07-13 NOTE — Assessment & Plan Note (Signed)
Feels emotionally dependent on Xanax

## 2014-07-13 NOTE — Assessment & Plan Note (Signed)
Very severe COPD, mixed type but primarily emphysema. Chronic oxygen-dependent respiratory failure. We discussed medication side effects and importance of minimizing systemic steroids. Plan-try reducing prednisone to 10 mg daily, follow-through on application for pulmonary rehabilitation at [facility], strict tobacco abstinence, DEXA scan because of chronic steroid use. Walk for endurance as tolerated. Try adding low-dose theophylline.

## 2014-07-13 NOTE — Assessment & Plan Note (Signed)
Oxygen dependent 2-3 L continuous/[employer]

## 2014-07-13 NOTE — Assessment & Plan Note (Signed)
She says she stopped smoking 11 days before this visit. I strongly encouraged her to continue. Think she has her husband's support.

## 2014-07-15 ENCOUNTER — Ambulatory Visit: Payer: Medicaid Other | Admitting: Adult Health

## 2014-07-16 ENCOUNTER — Other Ambulatory Visit: Payer: Self-pay | Admitting: Internal Medicine

## 2014-07-16 ENCOUNTER — Telehealth: Payer: Self-pay | Admitting: Internal Medicine

## 2014-07-16 MED ORDER — ALPRAZOLAM 0.5 MG PO TABS: 0.5000 mg | ORAL_TABLET | Freq: Two times a day (BID) | ORAL | Status: DC | PRN

## 2014-07-16 NOTE — Telephone Encounter (Signed)
Med called in to pharmacy, pt aware.  Nothing further needed.

## 2014-07-16 NOTE — Telephone Encounter (Signed)
OK to give xanax 0.5 mg, # 60, 1 twice daily if needed, ref x 3.

## 2014-07-16 NOTE — Telephone Encounter (Signed)
[phone] calling back again [facility]

## 2014-07-16 NOTE — Telephone Encounter (Signed)
Spoke with Dr Annamaria Boots, he states that his conversation with the pt on 07/12/14 visit was him asking her if she was asking him for a refill, not offering to refill the medication.  He states that the pt needs to receive this through her primary care doctor.  I spoke with pt to relay this, she states that she's never received this through her primary care doctor and has always received this through Dr. Melvyn Novas.  Her visit on 07/12/14 with CY was to switch care from [facility] to [facility].  I told pt that I would let CY know this, but also let her know that CY is off this afternoon.    Dr Donia Pounds, and please advise if this means you'd like to refill pt's xanax.  Thank you!

## 2014-07-16 NOTE — Telephone Encounter (Signed)
Attempted to call pt. Voicemail has not been set up. Will try back.

## 2014-07-16 NOTE — Telephone Encounter (Signed)
Pt states CY asked her at her 07-12-14 OV if she needed Xanax Rx and she told him no at that time but has decided she needs the Rx now. CY has never given her the Rx before. Pt is requesting we give her a call on her Home number: [phone] leave detailed message about CY's response.   CY please advise if okay to give Rx and what sig to use-pt is unsure how to take if needed. Thanks.

## 2014-07-19 ENCOUNTER — Ambulatory Visit (INDEPENDENT_AMBULATORY_CARE_PROVIDER_SITE_OTHER)
Admission: RE | Admit: 2014-07-19 | Discharge: 2014-07-19 | Disposition: A | Discharge status: Home / Self Care | Payer: Medicaid Other | Source: Ambulatory Visit | Attending: Family Medicine | Admitting: Family Medicine

## 2014-07-19 DIAGNOSIS — Z92241 Personal history of systemic steroid therapy: Secondary | ICD-10-CM

## 2014-07-20 NOTE — Progress Notes (Signed)
Quick Note:  Patient notified. No questions or concerns at this time. Nothing further needed.  ______ 

## 2014-08-13 ENCOUNTER — Telehealth: Payer: Self-pay | Admitting: Internal Medicine

## 2014-08-13 MED ORDER — THEOPHYLLINE ER 100 MG PO TB12: 100.0000 mg | ORAL_TABLET | Freq: Two times a day (BID) | ORAL | Status: DC

## 2014-08-13 MED ORDER — THEOPHYLLINE ER 200 MG PO TB12: 200.0000 mg | ORAL_TABLET | Freq: Every day | ORAL | Status: DC

## 2014-08-13 NOTE — Telephone Encounter (Signed)
Spoke with pt, states she needs refill on theophylline.  Pharmacy will not refill it with a "prn" refill count.  New rx sent to pharmacy with enough refills to last until next visit.  Nothing further needed.

## 2014-08-13 NOTE — Telephone Encounter (Signed)
Spoke with pt, she is aware of med change.  Med sent to pharmacy.  Nothing further needed.

## 2014-08-13 NOTE — Telephone Encounter (Signed)
Spoke with pharmacist, states the 100mg  is on permanent backorder.  The pharmacy has 200 and 300mg  but is extended release.    Dr. Annamaria Boots please advise.  Thank you.

## 2014-08-13 NOTE — Telephone Encounter (Signed)
Ok to try theophylline 200 mg, 1 daily, # 30, refill prn

## 2014-09-10 ENCOUNTER — Ambulatory Visit (INDEPENDENT_AMBULATORY_CARE_PROVIDER_SITE_OTHER): Payer: Medicaid Other | Admitting: Internal Medicine

## 2014-09-10 ENCOUNTER — Encounter: Payer: Self-pay | Admitting: Internal Medicine

## 2014-09-10 VITALS — BP 100/70 | HR 83 | Ht <= 58 in | Wt 133.0 lb

## 2014-09-10 DIAGNOSIS — Z720 Tobacco use: Secondary | ICD-10-CM

## 2014-09-10 DIAGNOSIS — J449 Chronic obstructive pulmonary disease, unspecified: Secondary | ICD-10-CM

## 2014-09-10 MED ORDER — PREDNISONE (PAK) 5 MG PO TABS: ORAL_TABLET | ORAL | Status: DC

## 2014-09-10 NOTE — Assessment & Plan Note (Signed)
She remains off of cigarettes and is motivated, understanding she needs to be off long enough to qualify for lung transplant consideration.

## 2014-09-10 NOTE — Progress Notes (Signed)
Subjective:    Patient ID: [name]    DOB: Jul 13, 1970    MRN: [id number]   Brief patient profile:  34  yowf smoker used 02 tent as infant and in 4th or 5th grade then did fine thru HS cheerleading and tol ex well then except  much worse sob  assoc with episodes of severe bronchitis then even in between episodes of  Bronchitis developed chronic doe around  2010 referred 08/23/2011 by Dr Arelia Sneddon to pulmonary clinic with GOLD II/III COPD by pft's 11/29/2011    HPI  03/16/2013 f/u ov/Wert stopped all meds March 2014 then downhill since then Chief Complaint  Patient presents with  . Follow-up    Pt states having increased SOB for the past 3 months.  She states has SOB with or without exertion. She has prod cough for the past 2 months-prod with green to yellow sputum.   still smoking but not daily. On acei with sensation of choking/throat irritation /sever cough not much better on aug/pred rx  >d/c ace , PPI /pepcid   04/07/2013 Follow up and med review  She is brought all her medications and her review We reviewed all her medications and organized them into a medication calendar with patient education It appears she is taking meds correctly.   Complains of  prod cough with thick green/yellow mucus, increased SOB, wheezing, chest tightness, hoarseness, some low grade temp x15 days Went to [facility] [place] screening last week for hoarseness was  Informed throat was ok. But to come back for more detailed exam.  No hemoptysis, chest pain, orthopnea, edema.  Has lost 10 lbs over last year.  Last ov ACE was stopped , has not seen any change in hoarseness or cough.  Still smoking , cessation discussed.  rec Omnicef 300mg  Twice daily  For 7 days  Prednisone taper over next week.  Must quit smoking .  I will call with chest xray  And lab results.  Brush /rinse and gargle after use  May use Armour/hammer baking soda/peroxide toothpaste  Mucinex DM Twice daily  As needed  Cough/congestion   Follow med calendar closely and bring to each visit      08/28/2013 f/u ov/Wert re: worse cough x 2 months/ still smoking / using med calendar / on 20 mg prednisone Chief Complaint  Patient presents with  . Follow-up    Pt still SOB, prod cough with green mucous.     coughing greeen since Xmas esp in am, some better p saba hfa but insists on using the neb first. >>augmentin x 10 d   10/30/13 Follow up and Med Review  Patient returns for a followup and medication review. Reviewed all her medications organized them into a medication calendar. Patient education. Patient has not smoked, encouraged on continued smoking cessation. Last visit. Patient had a slow to resolve bronchitis/ sinusitis She was treated with Augmentin for 10 days The symptoms are much improved She denies any hemoptysis, chest pain, orthopnea, PND, or leg swelling.  >>med calendar    12/14/2013 Acute OV sick since first week in May  Pt complains of increased SOB, wheezing, hoarseness, prod cough with green/yellow mucus for 6 weeks.  Waxing and waning  With congestion.  No fever for last few weeks.  Saw  PCP at onset and has finished 2 rounds augmentin.  fever at onset.  Last abx finished 3 days ago.  Wheezing is worse this last week.  No hemoptysis, chest pain , abd pain, n/v/d.  Husband getting surgery this week.  Mucinex DM is not helping.  Still smoking  rec Chest xray today  Prednisone taper over next week.  Mucinex DM Twice daily  As needed  Cough/congestion   02/05/2014 Follow up and Med Calendar  Patient returns for a 6 week followup and medication review. We reviewed all her medications organized them into a medication calendar with patient education. Appears that she is taking her medications correctly. Patient denies any hemoptysis, orthopnea, PND, or leg swelling. Says she still does not feel well , continues to cough up congestion w/ thick green mucus.  Complains of increased SOB, wheezing, prod  cough with green mucus, tightness since last ov.  Complains of left ear fullness . Was started on Prednisone last visit but has not been able to taper off, remains on Prednisone 20mg  daily  rec Levaquin 500mg  daily for 10 days -take with food Eat yogurt , take probiotic while on antibiotics Prednisone taper today -use my taper -if not improved go back to med calendar prednisone instructions.  Use Zyrtec 10mg  At bedtime  For 5 days then As needed  Drainage.  Please contact office for sooner follow up if symptoms do not improve or worsen or seek emergency care    02/16/2014 f/u ov/Wert re: copd/ not back to baseline since early May 2015  Chief Complaint  Patient presents with  . Acute Visit    Pt c/o ear pain and prod cough with green sputum- symptoms are no better since last visit with TP on 02/05/14. She is using albuterol in her neb every 4 hours.   does have med calendar and appears to correlate with meds Denies smoking and husband confirms Behavior in office erratic C/o sob x 10 ft, not reproducible C/o constant cough with green mucus, none during interview exam or walk rec Stop tudorza Start spiriva respimat 2 pffs each am Please see patient coordinator before you leave today  to schedule sinus ct See calendar for specific medication instructions   Admit 8/18 = 02/27/14 for aecopd with mild hypercarbia and CTa neg x emphysema   03/17/2014 post hosp f/u/ re-establish  ov/Wert re: copd/f/u denies smoking  Chief Complaint  Patient presents with  . HFU    Pt states recently d/c'ed from [facility] for URI. She states that since she was discharged, her breathing is no better. She is using albuterol inhaler 3-4 times per day and albuterol neb approx 6 times per day.  feels like losing ground again since d/c from [place] and treated as aecopd Rambling hx will not answer specific questions "I know my body"  ? When were you last able to walk comfortably A last summer (2014) able to  walk at mall comfortably s 02.  Cough greenish tint / no better on abx  symbicort empty/ no mucinex  04/19/2014 Follow up and Medication calendar -COPD  Returns for follow up and medication review  We reviewed all her meds and organized them into a medication calendar w/ pt education.  Husband is with her today, she is very tearful and anxious throughout the exam. Very upset that her COPD has gotten this bad . Support provided with pt education .  Does not have symbicort with her today. Says it is at pharmacy their was a mix up with pharmacist.  [name] inhaler is set on 50 doses, this was set on 50 at last ov.  Assures me she is taking and has not missed any doses.  Says she can  afford they are only $3 each.  Says she has not smoked and agrees to urine nicotine check today as requested by Dr. Melvyn Novas  . We discussed the importance of smoking cessation.  She denies hemoptysis , chest pain, orthopnea or edema.  Discussed pulmonary rehab and she agrees to referral .   04/30/2014 [facility] follow up  Pt returns for post hospital follow up  Admitted 10/17-20 for COPD flare and hypercarbic RF (PCO2 was 70 on admit )  Tx w/ abx , steroids and nebs.  She is feeling better but still weak.  Had long discussion with pt and husband regarding her disease. She does admit That she smokes occasionally -we discussed dangers of O2 w/ smoking and her diseaase.  Has few days of steroids left. Has finished Levaquin .  Still coughing up thick mucus.  Denies chest pain, orthopnea, edema or hemoptysis.  >>pred taper     05/14/2014 Follow up (COPD /Hypercarbic/Hypoxic RF )  Returns for 2 week follow up for COPD .  Overall feels she is improved, breathing is returning to baseline Remains on prednisone 10mg  , 2 tabs daily . She has been referred to pulmonary rehab, waiting to hear back from them.  Cough and congestion are some better.  Has drainage in throat on/off with some hoarseness.  Denies chest pain,  orthopnea, edema or n/v.  >>taper prednisone , daliresp trial   06/17/2014 Follow up (COPD /Hypercarbic/Hypoxic RF )  Returns  4 week follow up -  Reports still having SOB walking and at rest. Under more stress, grandson born premature, at [employer].  Got 2 teeth pulled.  On prednisone 10mg  2 tabs daily  More anxious -took xanax with some help.  No fever, discolored mucus, chest pain, orthopnea or edema.  Tried on Daliresp last ov ,but did not like it, made her feel bad.  Admits smoked 2 draws off cigarette after death of friends son.  Requests help in the home , too weak to take a shower at times.  We discussed pulmonary rehab again .   07/12/14- 50 yoF transferring to me from Dr Melvyn Novas.  Hx of COPD /Hypercarbic/Hypoxic RF  GOLD III. O2 tent as infant. Chronic smoker. Husband here. Finding [name] a big help and continues Symbicort, nebulizer/metered inhaler. 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 Chronic resting tachycardia related to medications without other specific heart disease. She thinks COPD has progressed especially in the last year, beginning around an interval of acute bronchitis in May. Hospitalized twice at [facility]. She is waiting on approval for pulmonary rehabilitation program at [place].  Has been using home oxygen 2-3 liters/[employer]. Says she has not smoked in 11 days. Not much routine phlegm. Has been wheezing more in the last 6 months Chronic anxiety-made a point of telling me how she depends on her Xanax Up-to-date on flu and pneumococcal vaccine PFT 03/26/2014([facility])-very severe obstructive airways disease, hyperinflation, diffusion severely reduced, emphysema pattern with some response to bronchodilator. FVC 2.17/116%, FEV1 0.71/41%, FEV1/FVC 1.31/69%, TLC 213%, DLCO 30% a1AT- MM   09/10/14- Hx of COPD /Hypercarbic/Hypoxic RF  GOLD III. O2 tent as infant. Chronic smoker.  Husband here FOLLOWS  FOR: Pt on 3L Pulse 02 99% today. She is on 2L/ [employer] at rest (home). Pt states breathing unchanged. Theophylline some help. Maintenance prednisone has helped but we discussed need to get to the lowest dose. She is about to start pulmonary rehabilitation at [facility]. Recent  dental extractions and has bruise on left jaw. Had bone density checked. She asks about lung transplantation and understands she needs to be off of cigarettes and prednisone long enough to meet guidelines.  ROS-see HPI Constitutional:   No-   weight loss, night sweats, fevers, chills, fatigue, lassitude. HEENT:   No-  headaches, difficulty swallowing, tooth/dental problems, sore throat,       No-  sneezing, itching, ear ache, nasal congestion, post nasal drip,  CV:   chest pain, No-orthopnea, PND, swelling in lower extremities, anasarca,                                  dizziness, palpitations Resp: +shortness of breath with exertion or at rest.               productive cough,  + non-productive cough,  No- coughing up of blood.              No-   change in color of mucus.  No- wheezing.   Skin: No-   rash or lesions. GI:  No-   heartburn, indigestion, abdominal pain, nausea, vomiting,  GU:  MS:  No-   joint pain or swelling.   Neuro-     nothing unusual Psych:  No- change in mood or affect. No depression + anxiety.  No memory loss.  OBJ- Physical Exam  O2 2-3 L General- Alert, Oriented, Affect-appropriate, Distress- none acute, +talkative Skin- rash-none, lesions- none, excoriation- none Lymphadenopathy- none Head- atraumatic            Eyes- Gross vision intact, PERRLA, conjunctivae and secretions clear            Ears- Hearing, canals-normal            Nose- Clear, no-Septal dev, mucus, polyps, erosion, perforation             Throat- Mallampati II , mucosa clear , drainage- none, tonsils- atrophic Neck- flexible , trachea midline, no stridor , thyroid nl, carotid no bruit Chest - symmetrical excursion , unlabored            Heart/CV- RRR , no murmur , no gallop  , no rub, nl s1 s2                           - JVD- none , edema- none, stasis changes- none, varices- none           Lung- distant, clear to P&A, wheeze- none, cough- none , dullness-none, rub- none           Chest wall-  Abd-  Br/ Gen/ Rectal- Not done, not indicated Extrem- cyanosis- none, clubbing, none, atrophy- none, strength- nl Neuro- grossly intact to observation  Assessment & Plan:

## 2014-09-10 NOTE — Patient Instructions (Signed)
We are going to try to reduce prednisone just a little to alternate 10 mg with 5 mg every other day, using 5 mg pills. Script sent.  Suggest Vitamin D 1000- 2000 units/ day until you can discuss with Dr Arelia Sneddon.   Please call as needed

## 2014-09-10 NOTE — Assessment & Plan Note (Signed)
She remains oxygen dependent. We discussed lung transplant. Plan-try to reduce maintenance prednisone to alternate 5 with 10 mg every other day using 5 mg tablets

## 2014-09-16 ENCOUNTER — Telehealth: Payer: Self-pay | Admitting: Internal Medicine

## 2014-09-16 NOTE — Telephone Encounter (Signed)
lmtcb x1 for pt. 

## 2014-09-16 NOTE — Telephone Encounter (Signed)
Spoke with pt. States she is going to call tomorrow with their fax number.

## 2014-09-17 ENCOUNTER — Telehealth: Payer: Self-pay | Admitting: Pulmonary Disease

## 2014-09-17 ENCOUNTER — Telehealth: Payer: Self-pay | Admitting: Internal Medicine

## 2014-09-17 NOTE — Telephone Encounter (Signed)
Dr. Annamaria Boots, please advise if okay to use capsules? thanks

## 2014-09-17 NOTE — Telephone Encounter (Signed)
Yes, ok to change Rx to theophylline caps

## 2014-09-17 NOTE — Telephone Encounter (Signed)
Spoke with [name] at [facility] - aware of below per CY Nothing further needed.

## 2014-09-17 NOTE — Telephone Encounter (Signed)
needs last two ov notes sent to dr Juanetta Gosling office  ----------------------  Ov notes sent to provided fax #.  Nothing further needed.

## 2014-11-15 ENCOUNTER — Telehealth: Payer: Self-pay | Admitting: Internal Medicine

## 2014-11-15 MED ORDER — ALPRAZOLAM 0.5 MG PO TABS: 0.5000 mg | ORAL_TABLET | Freq: Two times a day (BID) | ORAL | Status: DC | PRN

## 2014-11-15 MED ORDER — PREDNISONE 5 MG PO TABS: ORAL_TABLET | ORAL | Status: DC

## 2014-11-15 NOTE — Telephone Encounter (Signed)
Alprazolam called in to pharmacy.   lmtcb X1 just to make pt aware that rx has been called in to pharmacy.

## 2014-11-15 NOTE — Telephone Encounter (Signed)
Ok to refill 

## 2014-11-15 NOTE — Telephone Encounter (Signed)
Last OV 09/10/14 Pending OV 12/13/14 Last refill of Xanax was on 07/16/14 #60 with 3 refills  Prednisone has already been sent in.  CY - please advise. Thanks.

## 2014-11-15 NOTE — Telephone Encounter (Signed)
Pt cb, informed pt that per Raliegh Ip has been called into pharm, pt verbalized understanding, nothing further needed

## 2014-11-15 NOTE — Telephone Encounter (Signed)
Pt got a call from pharmacy saying that her prednisone was ready. Pt's Xanax was not called it. Pt wanted to check status of this.  [phone]

## 2014-12-13 ENCOUNTER — Ambulatory Visit (INDEPENDENT_AMBULATORY_CARE_PROVIDER_SITE_OTHER): Payer: Medicaid Other | Admitting: Internal Medicine

## 2014-12-13 ENCOUNTER — Ambulatory Visit (INDEPENDENT_AMBULATORY_CARE_PROVIDER_SITE_OTHER)
Admission: RE | Admit: 2014-12-13 | Discharge: 2014-12-13 | Disposition: A | Discharge status: Home / Self Care | Payer: Medicaid Other | Source: Ambulatory Visit | Attending: Internal Medicine | Admitting: Internal Medicine

## 2014-12-13 ENCOUNTER — Encounter: Payer: Self-pay | Admitting: Internal Medicine

## 2014-12-13 VITALS — BP 102/68 | HR 109 | Ht <= 58 in | Wt 126.0 lb

## 2014-12-13 DIAGNOSIS — Z720 Tobacco use: Secondary | ICD-10-CM

## 2014-12-13 DIAGNOSIS — J9611 Chronic respiratory failure with hypoxia: Secondary | ICD-10-CM

## 2014-12-13 DIAGNOSIS — J432 Centrilobular emphysema: Secondary | ICD-10-CM

## 2014-12-13 DIAGNOSIS — J449 Chronic obstructive pulmonary disease, unspecified: Secondary | ICD-10-CM

## 2014-12-13 DIAGNOSIS — R0789 Other chest pain: Secondary | ICD-10-CM | POA: Diagnosis not present

## 2014-12-13 DIAGNOSIS — J438 Other emphysema: Secondary | ICD-10-CM | POA: Diagnosis not present

## 2014-12-13 DIAGNOSIS — J9612 Chronic respiratory failure with hypercapnia: Secondary | ICD-10-CM

## 2014-12-13 NOTE — Progress Notes (Signed)
Subjective:    Patient ID: [name]    DOB: 12-15-1970    MRN: [id number]   Brief patient profile:  54  yowf smoker used 02 tent as infant and in 4th or 5th grade then did fine thru HS cheerleading and tol ex well then except  much worse sob  assoc with episodes of severe bronchitis then even in between episodes of  Bronchitis developed chronic doe around  2010 referred 08/23/2011 by Dr Arelia Sneddon to pulmonary clinic with GOLD II/III COPD by pft's 11/29/2011    HPI  03/16/2013 f/u ov/Wert stopped all meds March 2014 then downhill since then Chief Complaint  Patient presents with  . Follow-up    Pt states having increased SOB for the past 3 months.  She states has SOB with or without exertion. She has prod cough for the past 2 months-prod with green to yellow sputum.   still smoking but not daily. On acei with sensation of choking/throat irritation /sever cough not much better on aug/pred rx  >d/c ace , PPI /pepcid   04/07/2013 Follow up and med review  She is brought all her medications and her review We reviewed all her medications and organized them into a medication calendar with patient education It appears she is taking meds correctly.   Complains of  prod cough with thick green/yellow mucus, increased SOB, wheezing, chest tightness, hoarseness, some low grade temp x15 days Went to ENT [place] screening last week for hoarseness was  Informed throat was ok. But to come back for more detailed exam.  No hemoptysis, chest pain, orthopnea, edema.  Has lost 10 lbs over last year.  Last ov ACE was stopped , has not seen any change in hoarseness or cough.  Still smoking , cessation discussed.  rec Omnicef 300mg  Twice daily  For 7 days  Prednisone taper over next week.  Must quit smoking .  I will call with chest xray  And lab results.  Brush /rinse and gargle after use  May use Armour/hammer baking soda/peroxide toothpaste  Mucinex DM Twice daily  As needed  Cough/congestion   Follow med calendar closely and bring to each visit      08/28/2013 f/u ov/Wert re: worse cough x 2 months/ still smoking / using med calendar / on 20 mg prednisone Chief Complaint  Patient presents with  . Follow-up    Pt still SOB, prod cough with green mucous.     coughing greeen since Xmas esp in am, some better p saba hfa but insists on using the neb first. >>augmentin x 10 d   10/30/13 Follow up and Med Review  Patient returns for a followup and medication review. Reviewed all her medications organized them into a medication calendar. Patient education. Patient has not smoked, encouraged on continued smoking cessation. Last visit. Patient had a slow to resolve bronchitis/ sinusitis She was treated with Augmentin for 10 days The symptoms are much improved She denies any hemoptysis, chest pain, orthopnea, PND, or leg swelling.  >>med calendar    12/14/2013 Acute OV sick since first week in May  Pt complains of increased SOB, wheezing, hoarseness, prod cough with green/yellow mucus for 6 weeks.  Waxing and waning  With congestion.  No fever for last few weeks.  Saw  PCP at onset and has finished 2 rounds augmentin.  fever at onset.  Last abx finished 3 days ago.  Wheezing is worse this last week.  No hemoptysis, chest pain , abd pain, n/v/d.  Husband getting surgery this week.  Mucinex DM is not helping.  Still smoking  rec Chest xray today  Prednisone taper over next week.  Mucinex DM Twice daily  As needed  Cough/congestion   02/05/2014 Follow up and Med Calendar  Patient returns for a 6 week followup and medication review. We reviewed all her medications organized them into a medication calendar with patient education. Appears that she is taking her medications correctly. Patient denies any hemoptysis, orthopnea, PND, or leg swelling. Says she still does not feel well , continues to cough up congestion w/ thick green mucus.  Complains of increased SOB, wheezing, prod  cough with green mucus, tightness since last ov.  Complains of left ear fullness . Was started on Prednisone last visit but has not been able to taper off, remains on Prednisone 20mg  daily  rec Levaquin 500mg  daily for 10 days -take with food Eat yogurt , take probiotic while on antibiotics Prednisone taper today -use my taper -if not improved go back to med calendar prednisone instructions.  Use Zyrtec 10mg  At bedtime  For 5 days then As needed  Drainage.  Please contact office for sooner follow up if symptoms do not improve or worsen or seek emergency care    02/16/2014 f/u ov/Wert re: copd/ not back to baseline since early May 2015  Chief Complaint  Patient presents with  . Acute Visit    Pt c/o ear pain and prod cough with green sputum- symptoms are no better since last visit with TP on 02/05/14. She is using albuterol in her neb every 4 hours.   does have med calendar and appears to correlate with meds Denies smoking and husband confirms Behavior in office erratic C/o sob x 10 ft, not reproducible C/o constant cough with green mucus, none during interview exam or walk rec Stop tudorza Start spiriva respimat 2 pffs each am Please see patient coordinator before you leave today  to schedule sinus ct See calendar for specific medication instructions   Admit 8/18 = 02/27/14 for aecopd with mild hypercarbia and CTa neg x emphysema   03/17/2014 post hosp f/u/ re-establish  ov/Wert re: copd/f/u denies smoking  Chief Complaint  Patient presents with  . HFU    Pt states recently d/c'ed from [facility] for URI. She states that since she was discharged, her breathing is no better. She is using albuterol inhaler 3-4 times per day and albuterol neb approx 6 times per day.  feels like losing ground again since d/c from [place] and treated as aecopd Rambling hx will not answer specific questions "I know my body"  ? When were you last able to walk comfortably A last summer (2014) able to  walk at mall comfortably s 02.  Cough greenish tint / no better on abx  symbicort empty/ no mucinex  04/19/2014 Follow up and Medication calendar -COPD  Returns for follow up and medication review  We reviewed all her meds and organized them into a medication calendar w/ pt education.  Husband is with her today, she is very tearful and anxious throughout the exam. Very upset that her COPD has gotten this bad . Support provided with pt education .  Does not have symbicort with her today. Says it is at pharmacy their was a mix up with pharmacist.  Caprice Renshaw inhaler is set on 50 doses, this was set on 50 at last ov.  Assures me she is taking and has not missed any doses.  Says she can  afford they are only $3 each.  Says she has not smoked and agrees to urine nicotine check today as requested by Dr. Melvyn Novas  . We discussed the importance of smoking cessation.  She denies hemoptysis , chest pain, orthopnea or edema.  Discussed pulmonary rehab and she agrees to referral .   04/30/2014 [facility] follow up  Pt returns for post hospital follow up  Admitted 10/17-20 for COPD flare and hypercarbic RF (PCO2 was 70 on admit )  Tx w/ abx , steroids and nebs.  She is feeling better but still weak.  Had long discussion with pt and husband regarding her disease. She does admit That she smokes occasionally -we discussed dangers of O2 w/ smoking and her diseaase.  Has few days of steroids left. Has finished Levaquin .  Still coughing up thick mucus.  Denies chest pain, orthopnea, edema or hemoptysis.  >>pred taper     05/14/2014 Follow up (COPD /Hypercarbic/Hypoxic RF )  Returns for 2 week follow up for COPD .  Overall feels she is improved, breathing is returning to baseline Remains on prednisone 10mg  , 2 tabs daily . She has been referred to pulmonary rehab, waiting to hear back from them.  Cough and congestion are some better.  Has drainage in throat on/off with some hoarseness.  Denies chest pain,  orthopnea, edema or n/v.  >>taper prednisone , daliresp trial   06/17/2014 Follow up (COPD /Hypercarbic/Hypoxic RF )  Returns  4 week follow up -  Reports still having SOB walking and at rest. Under more stress, grandson born premature, at [employer].  Got 2 teeth pulled.  On prednisone 10mg  2 tabs daily  More anxious -took xanax with some help.  No fever, discolored mucus, chest pain, orthopnea or edema.  Tried on Daliresp last ov ,but did not like it, made her feel bad.  Admits smoked 2 draws off cigarette after death of friends son.  Requests help in the home , too weak to take a shower at times.  We discussed pulmonary rehab again .   07/12/14- 41 yoF transferring to me from Dr Melvyn Novas.  Hx of COPD /Hypercarbic/Hypoxic RF  GOLD III. O2 tent as infant. Chronic smoker. Husband here. Finding Caprice Renshaw a big help and continues Symbicort, nebulizer/metered inhaler. 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 Chronic resting tachycardia related to medications without other specific heart disease. She thinks COPD has progressed especially in the last year, beginning around an interval of acute bronchitis in May. Hospitalized twice at [facility]. She is waiting on approval for pulmonary rehabilitation program at [facility].  Has been using home oxygen 2-3 liters/[employer]. Says she has not smoked in 11 days. Not much routine phlegm. Has been wheezing more in the last 6 months Chronic anxiety-made a point of telling me how she depends on her Xanax Up-to-date on flu and pneumococcal vaccine PFT 03/26/2014([facility])-very severe obstructive airways disease, hyperinflation, diffusion severely reduced, emphysema pattern with some response to bronchodilator. FVC 2.17/116%, FEV1 0.71/41%, FEV1/FVC 1.31/69%, TLC 213%, DLCO 30% a1AT- MM  09/10/14- Hx of COPD /Hypercarbic/Hypoxic RF  GOLD III. O2 tent as infant. Chronic smoker.  Husband here FOLLOWS  FOR: Pt on 3L Pulse 02 99% today. She is on 2L/ [employer] at rest (home). Pt states breathing unchanged. Theophylline some help. Maintenance prednisone has helped but we discussed need to get to the lowest dose. She is about to start pulmonary rehabilitation at [facility]. Recent dental  extractions and has bruise on left jaw. Had bone density checked. She asks about lung transplantation and understands she needs to be off of cigarettes and prednisone long enough to meet guidelines.  12/13/14-43 yoF former smoker followed for COPD /Hypercarbic/Hypoxic RF  GOLD III. O2 tent as infant. .  Husband here Reports: SOB, wheezing at times, on O2 2L at night , 3L during the day / [employer] Caught cold from grandchildren, managed with home nebulizer. Humid weather is hard for her. Scant phlegm. Taking theophylline 200 mg once daily and asks about higher dose. Notes that history of spine injury and her lung disease limited activity significantly. She is concerned about atypical chest pain described as sharp at upper sternum and sometimes into left upper arm. Not clearly exertional. Asks cardiology evaluation of coronary risk because of her history of smoking.  ROS-see HPI Constitutional:   No-   weight loss, night sweats, fevers, chills, fatigue, lassitude. HEENT:   No-  headaches, difficulty swallowing, tooth/dental problems, sore throat,       No-  sneezing, itching, ear ache, nasal congestion, post nasal drip,  CV:   +chest pain, No-orthopnea, PND, swelling in lower extremities, anasarca,                                  dizziness, palpitations Resp: +shortness of breath with exertion or at rest.               +productive cough,  + non-productive cough,  No- coughing up of blood.              No-   change in color of mucus.  No- wheezing.   Skin: No-   rash or lesions. GI:  No-   heartburn, indigestion, abdominal pain, nausea, vomiting,  GU:  MS:  No-   joint pain or swelling.   Neuro-     nothing unusual Psych:   No- change in mood or affect. No depression + anxiety.  No memory loss.  OBJ- Physical Exam  O2 2-3 L General- Alert, Oriented, Affect-appropriate, Distress- none acute, +talkative Skin- rash-none, lesions- none, excoriation- none Lymphadenopathy- none Head- atraumatic            Eyes- Gross vision intact, PERRLA, conjunctivae and secretions clear            Ears- Hearing, canals-normal            Nose- Clear, no-Septal dev, mucus, polyps, erosion, perforation             Throat- Mallampati II , mucosa clear , drainage- none, tonsils- atrophic Neck- flexible , trachea midline, no stridor , thyroid nl, carotid no bruit Chest - symmetrical excursion , unlabored           Heart/CV- RRR , no murmur , no gallop  , no rub, nl s1 s2                           - JVD- none , edema- none, stasis changes- none, varices- none           Lung- distant,  wheeze+ trace, cough- none , dullness-none, rub- none           Chest wall- + kyphosis Abd-  Br/ Gen/ Rectal- Not done, not indicated Extrem- cyanosis- none, clubbing, none, atrophy- none, strength- nl Neuro- grossly intact to observation  Assessment & Plan:

## 2014-12-13 NOTE — Patient Instructions (Addendum)
Script for rolling walker with O2 carrier for dx chronic hypoxic respiratory failure  Order- refer to cardiology for risk stratification. Atypical chest pain, Emphysema  Order- CXR   Dx Emphysema centrilobular

## 2014-12-14 NOTE — Assessment & Plan Note (Signed)
Severe COPD-emphysema Plan-she requests rolling walker with oxygen carrier. This is reasonable and will extend her mobility for necessary travel.

## 2014-12-14 NOTE — Assessment & Plan Note (Signed)
Strong reinforcement to avoid recidivism

## 2014-12-14 NOTE — Assessment & Plan Note (Signed)
She is tolerating oxygen at 2-3 L without symptoms of CO2 narcosis.

## 2014-12-18 ENCOUNTER — Other Ambulatory Visit: Payer: Self-pay | Admitting: Internal Medicine

## 2014-12-21 ENCOUNTER — Other Ambulatory Visit: Payer: Self-pay | Admitting: Internal Medicine

## 2015-01-17 ENCOUNTER — Other Ambulatory Visit: Payer: Self-pay | Admitting: Family Medicine

## 2015-01-17 DIAGNOSIS — E041 Nontoxic single thyroid nodule: Secondary | ICD-10-CM

## 2015-01-18 ENCOUNTER — Ambulatory Visit: Payer: Medicaid Other | Admitting: Cardiovascular Disease

## 2015-01-21 ENCOUNTER — Other Ambulatory Visit: Payer: Medicaid Other

## 2015-01-28 ENCOUNTER — Other Ambulatory Visit: Payer: Medicaid Other

## 2015-02-04 ENCOUNTER — Other Ambulatory Visit: Payer: Medicaid Other

## 2015-02-07 ENCOUNTER — Other Ambulatory Visit: Payer: Self-pay | Admitting: Family Medicine

## 2015-02-07 DIAGNOSIS — M4854XS Collapsed vertebra, not elsewhere classified, thoracic region, sequela of fracture: Secondary | ICD-10-CM

## 2015-02-14 ENCOUNTER — Other Ambulatory Visit: Payer: Self-pay | Admitting: Internal Medicine

## 2015-02-16 ENCOUNTER — Ambulatory Visit: Payer: Medicaid Other | Admitting: Cardiology

## 2015-02-19 ENCOUNTER — Ambulatory Visit
Admission: RE | Admit: 2015-02-19 | Discharge: 2015-02-19 | Disposition: A | Discharge status: Home / Self Care | Payer: Medicaid Other | Source: Ambulatory Visit | Attending: Family Medicine | Admitting: Family Medicine

## 2015-02-19 DIAGNOSIS — M4854XS Collapsed vertebra, not elsewhere classified, thoracic region, sequela of fracture: Secondary | ICD-10-CM

## 2015-02-19 MED ORDER — GADOBENATE DIMEGLUMINE 529 MG/ML IV SOLN: 10.0000 mL | Freq: Once | INTRAVENOUS | Status: DC | PRN

## 2015-02-19 NOTE — Progress Notes (Signed)
While injecting gadolinium for MRI, some extravasated in right antecubital area.  The injection was stopped and the patient was given ice for the area while the scan was completed.  Patient was complaining of pain in arm but felt better after icing.  Janey Greaser, RT

## 2015-04-15 ENCOUNTER — Ambulatory Visit: Payer: Medicaid Other | Admitting: Cardiology

## 2015-05-21 ENCOUNTER — Other Ambulatory Visit: Payer: Self-pay | Admitting: Internal Medicine

## 2015-06-14 ENCOUNTER — Ambulatory Visit: Payer: Medicaid Other | Admitting: Internal Medicine

## 2015-08-24 ENCOUNTER — Telehealth: Payer: Self-pay | Admitting: *Deleted

## 2015-08-24 NOTE — Telephone Encounter (Signed)
Received PA for [place] Help desk [phone] ID # [id number] [place]. Called spoke with [place] from [facility] tracks. This was approved from 08/24/15-08/18/16 [id number] Called [facility] and spoke with Inez Catalina and made aware. Nothing further needed

## 2015-09-09 ENCOUNTER — Ambulatory Visit: Payer: Medicaid Other | Admitting: Internal Medicine

## 2016-01-16 DIAGNOSIS — J189 Pneumonia, unspecified organism: Secondary | ICD-10-CM

## 2016-01-16 DIAGNOSIS — G894 Chronic pain syndrome: Secondary | ICD-10-CM

## 2016-01-16 DIAGNOSIS — F419 Anxiety disorder, unspecified: Secondary | ICD-10-CM

## 2016-01-17 DIAGNOSIS — F419 Anxiety disorder, unspecified: Secondary | ICD-10-CM

## 2016-01-17 DIAGNOSIS — J189 Pneumonia, unspecified organism: Secondary | ICD-10-CM | POA: Diagnosis not present

## 2016-01-17 DIAGNOSIS — G894 Chronic pain syndrome: Secondary | ICD-10-CM

## 2016-01-18 DIAGNOSIS — J189 Pneumonia, unspecified organism: Secondary | ICD-10-CM | POA: Diagnosis not present

## 2016-01-18 DIAGNOSIS — F419 Anxiety disorder, unspecified: Secondary | ICD-10-CM

## 2016-01-18 DIAGNOSIS — G894 Chronic pain syndrome: Secondary | ICD-10-CM

## 2016-01-19 DIAGNOSIS — F419 Anxiety disorder, unspecified: Secondary | ICD-10-CM | POA: Diagnosis not present

## 2016-01-19 DIAGNOSIS — G894 Chronic pain syndrome: Secondary | ICD-10-CM | POA: Diagnosis not present

## 2016-01-19 DIAGNOSIS — J189 Pneumonia, unspecified organism: Secondary | ICD-10-CM | POA: Diagnosis not present

## 2016-02-12 ENCOUNTER — Encounter (HOSPITAL_COMMUNITY): Payer: Self-pay | Admitting: Emergency Medicine

## 2016-02-12 ENCOUNTER — Inpatient Hospital Stay (HOSPITAL_COMMUNITY)
Admission: EM | Admit: 2016-02-12 | Discharge: 2016-02-15 | DRG: 552 | Disposition: A | Discharge status: Hospice | Payer: Medicaid Other | Attending: Family Medicine | Admitting: Family Medicine

## 2016-02-12 DIAGNOSIS — Z7951 Long term (current) use of inhaled steroids: Secondary | ICD-10-CM

## 2016-02-12 DIAGNOSIS — Z515 Encounter for palliative care: Secondary | ICD-10-CM | POA: Diagnosis present

## 2016-02-12 DIAGNOSIS — Z79891 Long term (current) use of opiate analgesic: Secondary | ICD-10-CM

## 2016-02-12 DIAGNOSIS — M549 Dorsalgia, unspecified: Principal | ICD-10-CM | POA: Diagnosis present

## 2016-02-12 DIAGNOSIS — Z7401 Bed confinement status: Secondary | ICD-10-CM

## 2016-02-12 DIAGNOSIS — Z888 Allergy status to other drugs, medicaments and biological substances status: Secondary | ICD-10-CM

## 2016-02-12 DIAGNOSIS — Z79899 Other long term (current) drug therapy: Secondary | ICD-10-CM

## 2016-02-12 DIAGNOSIS — Z7952 Long term (current) use of systemic steroids: Secondary | ICD-10-CM

## 2016-02-12 DIAGNOSIS — Z87891 Personal history of nicotine dependence: Secondary | ICD-10-CM

## 2016-02-12 DIAGNOSIS — J449 Chronic obstructive pulmonary disease, unspecified: Secondary | ICD-10-CM | POA: Diagnosis present

## 2016-02-12 DIAGNOSIS — Z8249 Family history of ischemic heart disease and other diseases of the circulatory system: Secondary | ICD-10-CM

## 2016-02-12 DIAGNOSIS — Z87440 Personal history of urinary (tract) infections: Secondary | ICD-10-CM

## 2016-02-12 DIAGNOSIS — R52 Pain, unspecified: Secondary | ICD-10-CM | POA: Diagnosis present

## 2016-02-12 DIAGNOSIS — Z800 Family history of malignant neoplasm of digestive organs: Secondary | ICD-10-CM

## 2016-02-12 DIAGNOSIS — Z825 Family history of asthma and other chronic lower respiratory diseases: Secondary | ICD-10-CM

## 2016-02-12 DIAGNOSIS — I10 Essential (primary) hypertension: Secondary | ICD-10-CM | POA: Diagnosis present

## 2016-02-12 DIAGNOSIS — Z66 Do not resuscitate: Secondary | ICD-10-CM | POA: Diagnosis present

## 2016-02-12 DIAGNOSIS — K219 Gastro-esophageal reflux disease without esophagitis: Secondary | ICD-10-CM | POA: Diagnosis present

## 2016-02-12 DIAGNOSIS — Z803 Family history of malignant neoplasm of breast: Secondary | ICD-10-CM

## 2016-02-12 DIAGNOSIS — Z8049 Family history of malignant neoplasm of other genital organs: Secondary | ICD-10-CM

## 2016-02-12 DIAGNOSIS — R0789 Other chest pain: Secondary | ICD-10-CM | POA: Diagnosis present

## 2016-02-12 MED ORDER — ONDANSETRON 4 MG PO TBDP
4.0000 mg | ORAL_TABLET | Freq: Once | ORAL | Status: AC
  Administered 2016-02-12: 4 mg via ORAL
  Filled 2016-02-12: qty 1

## 2016-02-12 NOTE — ED Provider Notes (Signed)
[facility] DEPT Provider Note   CSN: [id number] Arrival date & time: 02/12/16  2012  First Provider Contact:  First MD Initiated Contact with Patient 02/12/16 2055        History   Chief Complaint Chief Complaint  Patient presents with  . Respiratory Distress    HPI [name] is a 45 y.o. female.  45 y.o. female presents with intractable pain from home hospice. She has run out of morphine at home where she had previously been taking large amounts. Her hospice provider recommended she come for palliative care evaluation. She has COPD, has been in and out of hospice and hospitals for evaluations and refuses any workup or treatment currently.    The history is provided by the patient and a relative.    Past Medical History:  Diagnosis Date  . Arachnoid cyst   . COPD (chronic obstructive pulmonary disease) ([place])   . GERD (gastroesophageal reflux disease)   . Hypertension   . Tumor, thyroid     Patient Active Problem List   Diagnosis Date Noted  . Anxiety state 07/13/2014  . Acute exacerbation of chronic obstructive pulmonary disease (COPD) ([place]) 04/26/2014  . Acute encephalopathy 04/26/2014  . Hypercapnic respiratory failure, chronic ([place]) 04/24/2014  . Exercise hypoxemia 02/17/2014  . HBP (high blood pressure) 03/18/2013  . GERD (gastroesophageal reflux disease) 04/23/2012  . COPD GOLD II/III 11/29/2011  . Cough 08/23/2011  . Dyspnea 08/23/2011  . 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 BACK SURGERY  2006  . TUBAL LIGATION  2001    OB History    No data available       Home Medications    Prior to Admission medications   Medication Sig Start Date End Date Taking? Authorizing Provider  albuterol (PROAIR HFA) 108 (90 BASE) MCG/ACT inhaler Inhale 2 puffs into the lungs every 4 (four) hours as needed for wheezing or shortness of breath (((PLAN B))).     Historical Provider, MD  albuterol  (PROVENTIL) (2.5 MG/3ML) 0.083% nebulizer solution Take 2.5 mg by nebulization every 4 (four) hours as needed for wheezing or shortness of breath.    Historical Provider, MD  ALPRAZolam Duanne Moron) 0.5 MG tablet Take 1 tablet (0.5 mg total) by mouth 2 (two) times daily as needed for anxiety. 11/15/14   Deneise Lever, MD  AMITIZA 24 MCG capsule Take 24 mcg by mouth 2 (two) times daily. 06/30/14   Historical Provider, MD  amitriptyline (ELAVIL) 50 MG tablet Take 50 mg by mouth at bedtime as needed.     Historical Provider, MD  budesonide-formoterol (SYMBICORT) 160-4.5 MCG/ACT inhaler Inhale 2 puffs into the lungs 2 (two) times daily. (((PLAN A)))    Historical Provider, MD  cetirizine (ZYRTEC) 10 MG tablet Take 10 mg by mouth at bedtime as needed for allergies.    Historical Provider, MD  dexlansoprazole (DEXILANT) 60 MG capsule Take 60 mg by mouth daily.    Historical Provider, MD  dextromethorphan-guaiFENesin (MUCINEX DM) 30-600 MG per 12 hr tablet Take 1-2 tablets by mouth every 12 (twelve) hours as needed (w/ flutter).     Historical Provider, MD  famotidine (PEPCID) 20 MG tablet Take 20 mg by mouth daily.    Historical Provider, MD  fentaNYL (DURAGESIC - DOSED MCG/HR) 100 MCG/HR Place 100 mcg onto the skin every other day.    Historical Provider, MD  furosemide (LASIX) 80 MG tablet 1/2 tab by mouth daily as  needed    Historical Provider, MD  morphine (MSIR) 30 MG tablet Take 30 mg by mouth 4 (four) times daily. May add 1 every 4 hours as needed for severe pain    Historical Provider, MD  predniSONE (DELTASONE) 5 MG tablet ALTERNATE 2 TABLETS ONE DAY, THEN 1 TABLET THE NEXT 02/16/15   Deneise Lever, MD  SYMBICORT 160-4.5 MCG/ACT inhaler INHALE 2 PUFFS FIRST THING IN THE MORNING AND THEN 2 PUFFS ABOUT 12 HOURS LATER (PLAN A) 12/20/14   Tanda Rockers, MD  THEO-24 200 MG 24 hr capsule TAKE ONE CAPSULE BY MOUTH EVERY DAY 12/22/14   Deneise Lever, MD  theophylline (THEODUR) 200 MG 12 hr tablet Take 1  tablet (200 mg total) by mouth daily. 08/13/14   Deneise Lever, MD  TUDORZA PRESSAIR 400 MCG/ACT AEPB INHALE 1 PUFF INTO THE LUNGS 2 TIMES DAILY. 05/23/15   Deneise Lever, MD    Family History Family History  Problem Relation Age of Onset  . Emphysema Maternal Grandfather     was a smoker  . Asthma Maternal Grandfather   . Allergies Son   . Allergies Daughter   . Asthma Son     as a young child  . Asthma Maternal Aunt   . Heart disease Mother   . Uterine cancer Mother   . Heart disease Paternal Grandmother   . Breast cancer Paternal Grandmother   . Colon cancer Maternal Grandmother     Social History Social History  Substance Use Topics  . Smoking status: Former Smoker    Packs/day: 0.25    Years: 25.00    Types: Cigarettes    Quit date: 08/21/2013  . Smokeless tobacco: Never Used     Comment: Pt has not smoked in the past week due to her feeling ill 08/28/13  . Alcohol use No     Allergies   Cymbalta [duloxetine hcl]; Pregabalin; and Wellbutrin [bupropion]   Review of Systems Review of Systems  All other systems reviewed and are negative.    Physical Exam Updated Vital Signs BP 167/98 (BP Location: Right Arm)   Pulse 89   Temp 99 F (37.2 C) (Oral)   Resp 16   SpO2 96%   Physical Exam  Constitutional: She is oriented to person, place, and time. No distress.  Chronically ill appearing  HENT:  Head: Normocephalic.  Eyes: Conjunctivae are normal.  Neck: Neck supple. No tracheal deviation present.  Cardiovascular: Normal rate, regular rhythm and normal heart sounds.   Pulmonary/Chest: Effort normal. No respiratory distress. She has wheezes (faint inspiratory and expiratory).  Shallow respirations  Abdominal: Soft. She exhibits no distension. There is no tenderness.  Neurological: She is alert and oriented to person, place, and time.  Skin: Skin is warm and dry.  Vitals reviewed.    ED Treatments / Results  Labs (all labs ordered are listed, but  only abnormal results are displayed) Labs Reviewed - No data to display  EKG  EKG Interpretation None       Radiology No results found.  Procedures Procedures (including critical care time)  Medications Ordered in ED Medications  sodium chloride flush (NS) 0.9 % injection 3 mL (3 mLs Intravenous Given 02/13/16 0114)  sodium chloride flush (NS) 0.9 % injection 3 mL (not administered)  0.9 %  sodium chloride infusion (not administered)  fentaNYL (DURAGESIC - dosed mcg/hr) patch 100 mcg (100 mcg Transdermal Patch Applied 02/13/16 0108)  HYDROmorphone (DILAUDID) injection 3 mg (3  mg Intravenous Given 02/13/16 0108)  chlorhexidine (PERIDEX) 0.12 % solution 15 mL (not administered)  antiseptic oral rinse (CPC / CETYLPYRIDINIUM CHLORIDE 0.05%) solution 7 mL (not administered)  ondansetron (ZOFRAN-ODT) disintegrating tablet 4 mg (4 mg Oral Given 02/12/16 2203)     Initial Impression / Assessment and Plan / ED Course  I have reviewed the triage vital signs and the nursing notes.  Pertinent labs & imaging results that were available during my care of the patient were reviewed by me and considered in my medical decision making (see chart for details).  Clinical Course   75 old female with history of chronic obstructive pulmonary disease that has been deemed end-stage and is enrolled in home hospice presents after running out of her pain medications early accompanied by family who is concerned at hospice will not refill her medications and her wishing to taper them off. She is complaining of ongoing pain and is somnolent on my evaluation.  I spoke with Dr Nyra Capes at the hospice practice. She has been in 2 weeks in hospice care with [facility]. She was discharged from 2 other hocpice practices previously. Had been on over 540 mg morphine daily. 100 MS contin q8h, 60 MSIR every 8 hrs- joined hospice with enough MSIR left to make it to middle of this month but had run out early. Reportedly had 9 pills left  at 9 am today and none by noon. Concern for diversion to husband or other family members. 3 nurses at hospice thought she might be passing away and later were concerned that she was feigning illness to receive more pain medications. Dr. Nyra Capes is concerned the patient may be developing morphine-induced neuro-toxicosis which may be exacerbating her chronic pain. They did not feel comfortable prescribing any more morphine with the above listed concerns and the patient presented today because she is out of pain medication and wishes to consider inpatient palliative care referral. Dr. Nyra Capes recommended that we consider tapering her morphine doses. I discussed the patient with the admitting hospitalist Dr. Myna Hidalgo who agreed to admit the patient for management of intractable pain and palliative care consultation.  The patient does have COPD but has no acute breathing complaints. I asked the patient would like to be screened with radiology, blood work, or other interventions and she declined any workup at this point choosing to only pursue a palliative course.  Final Clinical Impressions(s) / ED Diagnoses   Final diagnoses:  Chronic obstructive pulmonary disease, unspecified COPD type ([place])  Encounter for palliative care    New Prescriptions New Prescriptions   No medications on file     Leo Grosser, MD 02/13/16 0345

## 2016-02-12 NOTE — ED Triage Notes (Signed)
Per EMS pt from home, pt is a hospice pt of [facility] from COPD. Pt lives in [place] but wanted to be sent to [place]. EMS states the family wants the pt to be sent to a palliative care floor. Pt is on 3L [place] continuously. Breath sounds diminished.

## 2016-02-12 NOTE — ED Notes (Signed)
Pt states she does not want any IV, blood work or diagnostic tests done and would just like pain management.

## 2016-02-12 NOTE — ED Notes (Signed)
Bed: WA06 Expected date:  Expected time:  Means of arrival:  Comments: EMS 44yo F resp distress / hospice pt

## 2016-02-13 DIAGNOSIS — K219 Gastro-esophageal reflux disease without esophagitis: Secondary | ICD-10-CM | POA: Diagnosis present

## 2016-02-13 DIAGNOSIS — R52 Pain, unspecified: Secondary | ICD-10-CM | POA: Diagnosis present

## 2016-02-13 DIAGNOSIS — Z803 Family history of malignant neoplasm of breast: Secondary | ICD-10-CM | POA: Diagnosis not present

## 2016-02-13 DIAGNOSIS — Z825 Family history of asthma and other chronic lower respiratory diseases: Secondary | ICD-10-CM | POA: Diagnosis not present

## 2016-02-13 DIAGNOSIS — R0789 Other chest pain: Secondary | ICD-10-CM | POA: Diagnosis present

## 2016-02-13 DIAGNOSIS — Z7952 Long term (current) use of systemic steroids: Secondary | ICD-10-CM | POA: Diagnosis not present

## 2016-02-13 DIAGNOSIS — M549 Dorsalgia, unspecified: Secondary | ICD-10-CM | POA: Diagnosis present

## 2016-02-13 DIAGNOSIS — Z800 Family history of malignant neoplasm of digestive organs: Secondary | ICD-10-CM | POA: Diagnosis not present

## 2016-02-13 DIAGNOSIS — J438 Other emphysema: Secondary | ICD-10-CM | POA: Diagnosis not present

## 2016-02-13 DIAGNOSIS — Z515 Encounter for palliative care: Secondary | ICD-10-CM | POA: Diagnosis present

## 2016-02-13 DIAGNOSIS — Z7951 Long term (current) use of inhaled steroids: Secondary | ICD-10-CM | POA: Diagnosis not present

## 2016-02-13 DIAGNOSIS — Z7401 Bed confinement status: Secondary | ICD-10-CM | POA: Diagnosis not present

## 2016-02-13 DIAGNOSIS — Z8249 Family history of ischemic heart disease and other diseases of the circulatory system: Secondary | ICD-10-CM | POA: Diagnosis not present

## 2016-02-13 DIAGNOSIS — Z79899 Other long term (current) drug therapy: Secondary | ICD-10-CM | POA: Diagnosis not present

## 2016-02-13 DIAGNOSIS — J449 Chronic obstructive pulmonary disease, unspecified: Secondary | ICD-10-CM

## 2016-02-13 DIAGNOSIS — Z66 Do not resuscitate: Secondary | ICD-10-CM | POA: Diagnosis present

## 2016-02-13 DIAGNOSIS — Z888 Allergy status to other drugs, medicaments and biological substances status: Secondary | ICD-10-CM | POA: Diagnosis not present

## 2016-02-13 DIAGNOSIS — R0600 Dyspnea, unspecified: Secondary | ICD-10-CM | POA: Diagnosis not present

## 2016-02-13 DIAGNOSIS — I10 Essential (primary) hypertension: Secondary | ICD-10-CM | POA: Diagnosis present

## 2016-02-13 DIAGNOSIS — Z79891 Long term (current) use of opiate analgesic: Secondary | ICD-10-CM | POA: Diagnosis not present

## 2016-02-13 DIAGNOSIS — Z87440 Personal history of urinary (tract) infections: Secondary | ICD-10-CM | POA: Diagnosis not present

## 2016-02-13 DIAGNOSIS — Z8049 Family history of malignant neoplasm of other genital organs: Secondary | ICD-10-CM | POA: Diagnosis not present

## 2016-02-13 DIAGNOSIS — Z87891 Personal history of nicotine dependence: Secondary | ICD-10-CM | POA: Diagnosis not present

## 2016-02-13 MED ORDER — CETYLPYRIDINIUM CHLORIDE 0.05 % MT LIQD
7.0000 mL | Freq: Two times a day (BID) | OROMUCOSAL | Status: DC
  Administered 2016-02-13 – 2016-02-15 (×4): 7 mL via OROMUCOSAL

## 2016-02-13 MED ORDER — SODIUM CHLORIDE 0.9% FLUSH
3.0000 mL | Freq: Two times a day (BID) | INTRAVENOUS | Status: DC
  Administered 2016-02-13 – 2016-02-15 (×6): 3 mL via INTRAVENOUS

## 2016-02-13 MED ORDER — HYDROMORPHONE HCL 2 MG/ML IJ SOLN
3.0000 mg | INTRAMUSCULAR | Status: DC | PRN
  Administered 2016-02-13 – 2016-02-15 (×18): 3 mg via INTRAVENOUS
  Filled 2016-02-13 (×19): qty 2

## 2016-02-13 MED ORDER — SODIUM CHLORIDE 0.9 % IV SOLN: 250.0000 mL | INTRAVENOUS | Status: DC | PRN

## 2016-02-13 MED ORDER — CHLORHEXIDINE GLUCONATE 0.12 % MT SOLN
15.0000 mL | Freq: Two times a day (BID) | OROMUCOSAL | Status: DC
  Administered 2016-02-13 – 2016-02-15 (×4): 15 mL via OROMUCOSAL
  Filled 2016-02-13 (×3): qty 15

## 2016-02-13 MED ORDER — SODIUM CHLORIDE 0.9% FLUSH: 3.0000 mL | INTRAVENOUS | Status: DC | PRN

## 2016-02-13 MED ORDER — FENTANYL 100 MCG/HR TD PT72
100.0000 ug | MEDICATED_PATCH | TRANSDERMAL | Status: DC
  Administered 2016-02-13: 100 ug via TRANSDERMAL
  Filled 2016-02-13: qty 1

## 2016-02-13 NOTE — Progress Notes (Signed)
Patient ID: [name], female   DOB: 22-Jun-1971, 45 y.o.   MRN: [id number]    PROGRESS NOTE    [name]  [id number] DOB: 1970/09/11 DOA: 02/12/2016  PCP: Leonard Downing, MD   Brief Narrative:  45 y.o. 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 Patient has reportedly been on hospice at home, but has run out of her pain medications early and it would not be refilled by her hospice provider, reportedly due to concerns for possible diversion to her family and/or development of neuro toxicities.   Assessment & Plan:  Intractable pain  - A very complicated case socially and ethically - Palliative care consultation requested for symptom-control, disposition, and ethical concerns  - PCT consulted for assistance with pain management  - Pt confirms her wishes for palliation only; she refuses any other diagnostics or treatment  DVT prophylaxis: SCD's Code Status: DNR Family Communication: Patient at bedside  Disposition Plan: Home once pain controlled   Consultants:   PCT  Procedures:   None   Antimicrobials:   None   Subjective: No events overnight.   Objective: Vitals:   02/12/16 2304 02/13/16 0108 02/13/16 0701 02/13/16 1447  BP: 128/86 (!) 147/89 (!) 120/93 107/74  Pulse: 77 81 76 89  Resp: 16 16 16 16   Temp:  97.5 F (36.4 C) 99.3 F (37.4 C) 98.3 F (36.8 C)  TempSrc:  Oral Oral Oral  SpO2: 94% 94% 100% 99%  Weight:  55.6 kg (122 lb 9.6 oz)    Height:  4\' 10"  (1.473 m)      Intake/Output Summary (Last 24 hours) at 02/13/16 1933 Last data filed at 02/13/16 1447  Gross per 24 hour  Intake              360 ml  Output              602 ml  Net             -242 ml   Filed Weights   02/13/16 0108  Weight: 55.6 kg (122 lb 9.6 oz)    Examination:  General exam: Appears calm and comfortable  Respiratory  system: diminished breath sounds throughout  Cardiovascular system: S1 & S2 heard, RRR.  Gastrointestinal system: Abdomen is nondistended, soft and nontender.   Data Reviewed: I have personally reviewed following labs and imaging studies  Radiology Studies: No results found.  Scheduled Meds: . antiseptic oral rinse  7 mL Mouth Rinse q12n4p  . chlorhexidine  15 mL Mouth Rinse BID  . fentaNYL  100 mcg Transdermal Q72H  . sodium chloride flush  3 mL Intravenous Q12H   Continuous Infusions:    LOS: 0 days    Time spent: 20 minutes    Faye Ramsay, MD [facility] Pager [phone]  If 7PM-7AM, please contact night-coverage [email or web address] Password [facility] 02/13/2016, 7:33 PM

## 2016-02-13 NOTE — H&P (Signed)
History and Physical    [name] [id number] DOB: Oct 04, 1970 DOA: 02/12/2016  PCP: Leonard Downing, MD   Patient coming from: Home   Chief Complaint: Pain   HPI: [name] is a 45 y.o. 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 Patient has reportedly been on hospice at home, but his run out of her pain medications early and it would not be refilled by her hospice provider, reportedly due to concerns for possible diversion to her family and/or development of neuro toxicities. The patient confirms that she does not want any testing or treatment performed. She simply wants pain control and would like to find another hospice arrangement as she is now unable to receive pain medication through the current program.  ED Course: Upon arrival to the ED, patient is found to be afebrile, saturating adequately on 3 L/m supplemental oxygen, and with vitals otherwise stable. Patient complains of severe pain, particularly at the left lateral ribs, but notes pain in the back and bilateral legs as well. She is calling out asking for pain-relief. Palliative care team, social work, and Tourist information centre manager not currently available as it is overnight on a weekend, but the patient will be observed on the medical-surgical unit for pain control and palliative consultation as soon as that is available. Palliative care has also been consulted for the difficult ethical issues raised by this case.  Review of Systems:  Unable to obtain ROS secondary to the patient's clinical condition with respiratory distress and somnolence.  Past Medical History:  Diagnosis Date  . Arachnoid cyst   . COPD (chronic obstructive pulmonary disease) ([place])   . GERD (gastroesophageal reflux disease)   . Hypertension   . Tumor, thyroid     Past Surgical History:  Procedure Laterality Date  . BACK  SURGERY  2006  . TUBAL LIGATION  2001     reports that she quit smoking about 2 years ago. Her smoking use included Cigarettes. She has a 6.25 pack-year smoking history. She has never used smokeless tobacco. She reports that she does not drink alcohol or use drugs.  Allergies  Allergen Reactions  . Cymbalta [Duloxetine Hcl] Nausea And Vomiting  . Pregabalin Swelling  . Wellbutrin [Bupropion] Other (See Comments)    Pt stated that this was back from the 1990's.  This medication made her very nervous    Family History  Problem Relation Age of Onset  . Emphysema Maternal Grandfather     was a smoker  . Asthma Maternal Grandfather   . Allergies Son   . Allergies Daughter   . Asthma Son     as a young child  . Asthma Maternal Aunt   . Heart disease Mother   . Uterine cancer Mother   . Heart disease Paternal Grandmother   . Breast cancer Paternal Grandmother   . Colon cancer Maternal Grandmother      Prior to Admission medications   Medication Sig Start Date End Date Taking? Authorizing Provider  albuterol (PROAIR HFA) 108 (90 BASE) MCG/ACT inhaler Inhale 2 puffs into the lungs every 4 (four) hours as needed for wheezing or shortness of breath (((PLAN B))).    Yes Historical Provider, MD  albuterol (PROVENTIL) (2.5 MG/3ML) 0.083% nebulizer solution Take 2.5 mg by nebulization every 4 (four) hours as needed for wheezing or shortness of breath.   Yes Historical Provider, MD  ALPRAZolam (  XANAX) 1 MG tablet Take 1 mg by mouth 4 (four) times daily.   Yes Historical Provider, MD  AMITIZA 24 MCG capsule Take 24 mcg by mouth 2 (two) times daily as needed for constipation.  06/30/14  Yes Historical Provider, MD  atorvastatin (LIPITOR) 80 MG tablet Take 80 mg by mouth daily.   Yes Historical Provider, MD  budesonide-formoterol (SYMBICORT) 160-4.5 MCG/ACT inhaler Inhale 2 puffs into the lungs 2 (two) times daily. (((PLAN A)))   Yes Historical Provider, MD  busPIRone (BUSPAR) 15 MG tablet Take  15 mg by mouth 2 (two) times daily.   Yes Historical Provider, MD  ciprofloxacin (CIPRO) 500 MG tablet Take 500 mg by mouth 2 (two) times daily.   Yes Historical Provider, MD  citalopram (CELEXA) 20 MG tablet Take 20 mg by mouth daily.   Yes Historical Provider, MD  famotidine (PEPCID) 20 MG tablet Take 20 mg by mouth at bedtime.    Yes Historical Provider, MD  furosemide (LASIX) 20 MG tablet Take 20 mg by mouth daily.   Yes Historical Provider, MD  ipratropium (ATROVENT) 0.02 % nebulizer solution Inhale 3 mLs into the lungs 4 (four) times daily as needed for shortness of breath. 11/30/15  Yes Historical Provider, MD  montelukast (SINGULAIR) 10 MG tablet Take 10 mg by mouth at bedtime.   Yes Historical Provider, MD  morphine (MS CONTIN) 100 MG 12 hr tablet Take 100 mg by mouth every 8 (eight) hours.    Yes Historical Provider, MD  morphine (MSIR) 30 MG tablet Take 30 mg by mouth 4 (four) times daily. May add 1 every 4 hours as needed for severe pain   Yes Historical Provider, MD  predniSONE (DELTASONE) 20 MG tablet Take 20 mg by mouth 2 (two) times daily with a meal.   Yes Historical Provider, MD  SYMBICORT 160-4.5 MCG/ACT inhaler INHALE 2 PUFFS FIRST THING IN THE MORNING AND THEN 2 PUFFS ABOUT 12 HOURS LATER (PLAN A) 12/20/14  Yes Tanda Rockers, MD  THEO-24 200 MG 24 hr capsule TAKE ONE CAPSULE BY MOUTH EVERY DAY 12/22/14  Yes Deneise Lever, MD  traMADol (ULTRAM) 50 MG tablet Take 50 mg by mouth every 6 (six) hours as needed (for pain.).   Yes Historical Provider, MD  TUDORZA PRESSAIR 400 MCG/ACT AEPB INHALE 1 PUFF INTO THE LUNGS 2 TIMES DAILY. 05/23/15  Yes Deneise Lever, MD  ALPRAZolam Duanne Moron) 0.5 MG tablet Take 1 tablet (0.5 mg total) by mouth 2 (two) times daily as needed for anxiety. Patient not taking: Reported on 02/12/2016 11/15/14   Deneise Lever, MD  predniSONE (DELTASONE) 5 MG tablet ALTERNATE 2 TABLETS ONE DAY, THEN 1 TABLET THE NEXT Patient not taking: Reported on 02/12/2016 02/16/15    Deneise Lever, MD  theophylline (THEODUR) 200 MG 12 hr tablet Take 1 tablet (200 mg total) by mouth daily. Patient not taking: Reported on 02/12/2016 08/13/14   Deneise Lever, MD    Physical Exam: Vitals:   02/12/16 2015 02/12/16 2020 02/12/16 2033 02/12/16 2304  BP:  167/98  128/86  Pulse:  89  77  Resp:  16  16  Temp:  99 F (37.2 C)    TempSrc:  Oral    SpO2: 97% 93% 96% 94%      Constitutional: NAD, calm, in apparent discomfort, calling-out for help with pain Eyes: PERTLA, lids and conjunctivae normal ENMT: Mucous membranes are dry. Posterior pharynx clear of any exudate or lesions.   Neck:  normal, supple, no masses, no thyromegaly Respiratory: clear to auscultation bilaterally, occasional expiratory wheeze, no increased WOB.  Cardiovascular: S1 & S2 heard, regular rate and rhythm. No extremity edema. 2+ pedal pulses. No significant JVD. Abdomen: No distension, no tenderness, no masses palpated. Bowel sounds normal.  Musculoskeletal: no clubbing / cyanosis. No joint deformity upper and lower extremities.   Skin: Red papules about central forehead and face. Warm, dry, well-perfused. Neurologic: No gross facial asymmetry, moving all extremities spontaneously, PERRL, EOMI.  Psychiatric: Difficult to assess given the clinical scenario with uncontrolled pain.     Labs on Admission: I have personally reviewed following labs and imaging studies  CBC: No results for input(s): WBC, NEUTROABS, HGB, HCT, MCV, PLT in the last 168 hours. Basic Metabolic Panel: No results for input(s): NA, K, CL, CO2, GLUCOSE, BUN, CREATININE, CALCIUM, MG, PHOS in the last 168 hours. GFR: CrCl cannot be calculated (Unknown ideal weight.). Liver Function Tests: No results for input(s): AST, ALT, ALKPHOS, BILITOT, PROT, ALBUMIN in the last 168 hours. No results for input(s): LIPASE, AMYLASE in the last 168 hours. No results for input(s): AMMONIA in the last 168 hours. Coagulation Profile: No results  for input(s): INR, PROTIME in the last 168 hours. Cardiac Enzymes: No results for input(s): CKTOTAL, CKMB, CKMBINDEX, TROPONINI in the last 168 hours. BNP (last 3 results) No results for input(s): PROBNP in the last 8760 hours. HbA1C: No results for input(s): HGBA1C in the last 72 hours. CBG: No results for input(s): GLUCAP in the last 168 hours. Lipid Profile: No results for input(s): CHOL, HDL, LDLCALC, TRIG, CHOLHDL, LDLDIRECT in the last 72 hours. Thyroid Function Tests: No results for input(s): TSH, T4TOTAL, FREET4, T3FREE, THYROIDAB in the last 72 hours. Anemia Panel: No results for input(s): VITAMINB12, FOLATE, FERRITIN, TIBC, IRON, RETICCTPCT in the last 72 hours. Urine analysis: No results found for: COLORURINE, APPEARANCEUR, LABSPEC, PHURINE, GLUCOSEU, HGBUR, BILIRUBINUR, KETONESUR, PROTEINUR, UROBILINOGEN, NITRITE, LEUKOCYTESUR Sepsis Labs: @LABRCNTIP (procalcitonin:4,lacticidven:4) )No results found for this or any previous visit (from the past 240 hour(s)).   Radiological Exams on Admission: No results found.  EKG: Not performed, will obtain as appropriate.  Assessment/Plan  1. Intractable pain  - A very complicated case socially and ethically - Palliative care consultation requested for symptom-control, disposition, and ethical concerns  - Will attempt to achieve some control of her pain with fentanyl patch for basal coverage (had recently been on this) and Dilaudid IVP's for breakthrough - Given the amount of morphine that she has been accustomed to, it was explained to the pt and family that we will not be able to alleviate all of her pain, but will hopefully make things more tolerable tonight  - Pt confirms her wishes for palliation only; she refuses any other diagnostics or treatment   DVT prophylaxis: SCD's  Code Status: DNR  Family Communication: Children and husband updated at bedside  Disposition Plan: Observe on med-surg  Consults called: None Admission  status: Observation    Vianne Bulls, MD [facility] Pager [phone]  If 7PM-7AM, please contact night-coverage [email or web address] Password [email or web address]  02/13/2016, 12:07 AM

## 2016-02-13 NOTE — Progress Notes (Signed)
Consult request received Chart reviewed Discussed with bedside RN Patient seen and examined earlier this am Does not arouse, does not answer questions.  Has not eaten breakfast either Call placed, discussed with [place].   PLAN: Recommend family meeting tomorrow afternoon on 02-14-16 with all concerned parties- the undersigned, hospice liaison, husband, patient, other family members. Need to discuss goals of care, need to discuss issues pertaining to appropriate use of opioids.  Full note to follow.   Thank you for the consult.  Loistine Chance MD [facility] health palliative medicine team [phone] pager

## 2016-02-13 NOTE — Progress Notes (Signed)
   02/13/16 1000  Clinical Encounter Type  Visited With Family  Visit Type Initial;Psychological support;Spiritual support  Referral From Nurse  Consult/Referral To Chaplain  Spiritual Encounters  Spiritual Needs Emotional;Other (Comment) (Pastoral Conversation/Support)  Stress Factors  Patient Stress Factors Not reviewed  Family Stress Factors Lack of knowledge;Health changes;Major life changes   I visited with the patient per referral by the nurse for EOL issues. The patient was asleep upon my arrival and did not wake up during my visit. The patient's husband was at the bedside and was very concerned about the patient's medication changes. Patient's husband has a lack of knowledge about the patient's condition. He states that the family wants to know if the patient is at end-stage COPD or if it is the medication changes that has been causing the patient to be incoherent.  When talking about comforting factors, the patient's husband stated that the only thing that will give the family comfort is knowing the exact condition of the patient.  Follow-up was requested.   Please, contact Spiritual Care for further assistance.   [place] M.Div.

## 2016-02-14 DIAGNOSIS — R0600 Dyspnea, unspecified: Secondary | ICD-10-CM

## 2016-02-14 NOTE — Consult Note (Signed)
Consultation Note Date: 02/14/2016   Patient Name: [name]  DOB: 02-11-1971  MRN: [id number]  Age / Sex: 45 y.o., female  PCP: Leonard Downing, MD Referring Physician: Theodis Blaze, MD  Reason for Consultation: Establishing goals of care  HPI/Patient Profile: 45 y.o. female admitted on 02/12/2016   Clinical Assessment and Goals of Care:  Life limiting illness: Advanced, possibly end stage chronic obstructive pulmonary disease, failed back syndrome-chronic uncontrolled back pain, several back surgeries in the past  45 year old lady who lives at home with her husband, 3 children in [place], [place]. Patient's daughter primary historian present at the bedside states that in March 2017 the patient saw Dr. Su Ley with pulmonary in [place], [place]. She had also seen about pulmonary earlier. She had ongoing decline from advanced COPD. The decision was made to enroll the patient in hospice services. Initially the patient was enrolled in community hospice since the end of March 2017. She was supplied with nebulizer machine. She continued to have episodic chest discomfort and shortness of breath.  On 01/15/2016 the patient was admitted at [facility] where she was diagnosed with pneumonia. She received oral antibiotics. She was discharged from the hospital but apparently revoke hospice services and was readmitted back at [facility] with sepsis and pneumonia 01/16/2016. Towards the end of that hospitalization, she was discharged to skilled nursing facility rehabilitation at [place] on 01-20-16. Family states that patient was not able to participate in rehabilitation she continued to have episodic confusion chest discomfort and shortness of breath. She got signed up with [facility] services 7-19 and went home 01-27-16.  The patient essentially has remained bedbound and confused  since that time. Her pain has been uncontrolled. Her family members have tried multiple times to get her placed in hospice house in [place]. At some point placement of a pump or higher medications was requested. On 7-31 family states that they called hospice and the patient arrived back at [facility] emergency department where she was diagnosed with a urinary tract infection. Since that time the patient has not been eating much. On 8-2, 8-3 hospice nurses from [facility] came to the patient's home and evaluated the patient. Her medication regimen changes were made. Namely the patient reportedly was on transdermal fentanyl for several years and this was recently discontinued. She was placed on MS Contin 100 mg every 8 hours. Additionally, opioid rotation was also done recently where immediate release morphine sulfate was discontinued and the patient was placed on tramadol. Within the course of the past 24-48 hours prior to the patient's arrival at [facility] the patient was noted to have ongoing pain, confusion, episodic shortness of breath. She was also noted to have elevated temperatures subjective fevers. She was reportedly moaning and groaning and had increased to swelling in her lower extremities. [facility] physician deems it appropriate that the patient be transferred to a hospital with palliative care services for reevaluation.  The patient has since been admitted to the hospitalist service. She  has been placed on transdermal fentanyl 100 g change every 72 hours. Additionally, she has been placed on IV Dilaudid when necessary. Palliative care consultation for appropriate symptom management, goals of care discussions.  Patient is a much older than stated age appearing frail lady resting in bed. She is essentially unresponsive. She has shallow breathing. She has signed respirations. Family present at the bedside. I introduced myself and palliative care as follows: Palliative  medicine is specialized medical care for people living with serious illness. It focuses on providing relief from the symptoms and stress of a serious illness. The goal is to improve quality of life for both the patient and the family.  Family states that the patient is sick and tired of suffering. The state that she has had lifelong problems with shortness of breath. She has had prolonged problems with back discomfort. They state that it is the patient's goals and values to be kept as comfortable as possible. Indeed, since the time of admission, the patient has refused any kind of imaging or blood work. Goals are strictly to focus exclusively on comfort as a singular goal.  Discussed with family members about appropriate symptom management at end-of-life. Agree with continuation of transdermal fentanyl and IV Dilaudid as needed for now. Discussed about hospice of [place] [facility]-residential hospice at discharge. [facility] liaison Ms. Bambi was also present for the family meeting. See recommendations below. Thank you for the consult. All questions answered to the best of my ability.  NEXT OF KIN  Spouse, 2 daughters, son make decisions jointly as a unit  SUMMARY OF RECOMMENDATIONS    DO NOT RESUSCITATE/DO NOT INTUBATE Residential hospice at discharge Full scope of comfort measures only Continue fentanyl patch plus IV Dilaudid as needed basis  Code Status/Advance Care Planning:  DNR    Symptom Management:    Transdermal fentanyl patch 100 g change every 72 hours, IV Dilaudid on an as-needed basis  Palliative Prophylaxis:   Bowel Regimen  Additional Recommendations (Limitations, Scope, Preferences):  Full Comfort Care  Psycho-social/Spiritual:   Desire for further Chaplaincy support:yes  Additional Recommendations: Education on Hospice  Prognosis:   < 2 weeks  Discharge Planning: Hospice facility      Primary Diagnoses: Present on Admission: . COPD (chronic obstructive  pulmonary disease) ([place]) . Intractable pain   I have reviewed the medical record, interviewed the patient and family, and examined the patient. The following aspects are pertinent.  Past Medical History:  Diagnosis Date  . Arachnoid cyst   . COPD (chronic obstructive pulmonary disease) ([place])   . GERD (gastroesophageal reflux disease)   . Hypertension   . Tumor, thyroid    Social History   Social History  . Marital status: Married    Spouse name: N/A  . Number of children: 3  . Years of education: N/A   Occupational History  . Disabled    Social History Main Topics  . Smoking status: Former Smoker    Packs/day: 0.25    Years: 25.00    Types: Cigarettes    Quit date: 08/21/2013  . Smokeless tobacco: Never Used     Comment: Pt has not smoked in the past week due to her feeling ill 08/28/13  . Alcohol use No  . Drug use: No  . Sexual activity: Not Asked   Other Topics Concern  . None   Social History Narrative  . None   Family History  Problem Relation Age of Onset  . Emphysema Maternal Grandfather  was a smoker  . Asthma Maternal Grandfather   . Allergies Son   . Allergies Daughter   . Asthma Son     as a young child  . Asthma Maternal Aunt   . Heart disease Mother   . Uterine cancer Mother   . Heart disease Paternal Grandmother   . Breast cancer Paternal Grandmother   . Colon cancer Maternal Grandmother    Scheduled Meds: . antiseptic oral rinse  7 mL Mouth Rinse q12n4p  . chlorhexidine  15 mL Mouth Rinse BID  . fentaNYL  100 mcg Transdermal Q72H  . sodium chloride flush  3 mL Intravenous Q12H   Continuous Infusions:  PRN Meds:.sodium chloride, HYDROmorphone (DILAUDID) injection, sodium chloride flush Medications Prior to Admission:  Prior to Admission medications   Medication Sig Start Date End Date Taking? Authorizing Provider  albuterol (PROAIR HFA) 108 (90 BASE) MCG/ACT inhaler Inhale 2 puffs into the lungs every 4 (four) hours as needed for  wheezing or shortness of breath (((PLAN B))).    Yes Historical Provider, MD  albuterol (PROVENTIL) (2.5 MG/3ML) 0.083% nebulizer solution Take 2.5 mg by nebulization every 4 (four) hours as needed for wheezing or shortness of breath.   Yes Historical Provider, MD  ALPRAZolam Duanne Moron) 1 MG tablet Take 1 mg by mouth 4 (four) times daily.   Yes Historical Provider, MD  AMITIZA 24 MCG capsule Take 24 mcg by mouth 2 (two) times daily as needed for constipation.  06/30/14  Yes Historical Provider, MD  atorvastatin (LIPITOR) 80 MG tablet Take 80 mg by mouth daily.   Yes Historical Provider, MD  budesonide-formoterol (SYMBICORT) 160-4.5 MCG/ACT inhaler Inhale 2 puffs into the lungs 2 (two) times daily. (((PLAN A)))   Yes Historical Provider, MD  busPIRone (BUSPAR) 15 MG tablet Take 15 mg by mouth 2 (two) times daily.   Yes Historical Provider, MD  ciprofloxacin (CIPRO) 500 MG tablet Take 500 mg by mouth 2 (two) times daily.   Yes Historical Provider, MD  citalopram (CELEXA) 20 MG tablet Take 20 mg by mouth daily.   Yes Historical Provider, MD  famotidine (PEPCID) 20 MG tablet Take 20 mg by mouth at bedtime.    Yes Historical Provider, MD  furosemide (LASIX) 20 MG tablet Take 20 mg by mouth daily.   Yes Historical Provider, MD  ipratropium (ATROVENT) 0.02 % nebulizer solution Inhale 3 mLs into the lungs 4 (four) times daily as needed for shortness of breath. 11/30/15  Yes Historical Provider, MD  montelukast (SINGULAIR) 10 MG tablet Take 10 mg by mouth at bedtime.   Yes Historical Provider, MD  morphine (MS CONTIN) 100 MG 12 hr tablet Take 100 mg by mouth every 8 (eight) hours.    Yes Historical Provider, MD  morphine (MSIR) 30 MG tablet Take 30 mg by mouth 4 (four) times daily. May add 1 every 4 hours as needed for severe pain   Yes Historical Provider, MD  predniSONE (DELTASONE) 20 MG tablet Take 20 mg by mouth 2 (two) times daily with a meal.   Yes Historical Provider, MD  SYMBICORT 160-4.5 MCG/ACT inhaler  INHALE 2 PUFFS FIRST THING IN THE MORNING AND THEN 2 PUFFS ABOUT 12 HOURS LATER (PLAN A) 12/20/14  Yes Tanda Rockers, MD  THEO-24 200 MG 24 hr capsule TAKE ONE CAPSULE BY MOUTH EVERY DAY 12/22/14  Yes Deneise Lever, MD  traMADol (ULTRAM) 50 MG tablet Take 50 mg by mouth every 6 (six) hours as needed (for pain.).  Yes Historical Provider, MD  TUDORZA PRESSAIR 400 MCG/ACT AEPB INHALE 1 PUFF INTO THE LUNGS 2 TIMES DAILY. 05/23/15  Yes Deneise Lever, MD  ALPRAZolam Duanne Moron) 0.5 MG tablet Take 1 tablet (0.5 mg total) by mouth 2 (two) times daily as needed for anxiety. Patient not taking: Reported on 02/12/2016 11/15/14   Deneise Lever, MD  predniSONE (DELTASONE) 5 MG tablet ALTERNATE 2 TABLETS ONE DAY, THEN 1 TABLET THE NEXT Patient not taking: Reported on 02/12/2016 02/16/15   Deneise Lever, MD  theophylline (THEODUR) 200 MG 12 hr tablet Take 1 tablet (200 mg total) by mouth daily. Patient not taking: Reported on 02/12/2016 08/13/14   Deneise Lever, MD   Allergies  Allergen Reactions  . Cymbalta [Duloxetine Hcl] Nausea And Vomiting  . Pregabalin Swelling  . Wellbutrin [Bupropion] Other (See Comments)    Pt stated that this was back from the 1990's.  This medication made her very nervous   Review of Systems + for back pain, + for dyspnea.   Physical Exam Ordered than stated age appearing weak cachectic lady resting in bed Unresponsive Shallow breathing, signs/gasping respirations S1-S2 Abdomen soft Extremities thin, muscle wasting Tight /diminished breath sounds  Vital Signs: BP 128/83 (BP Location: Left Arm)   Pulse 93   Temp 98.7 F (37.1 C) (Oral)   Resp 16   Ht 4\' 10"  (1.473 m)   Wt 55.6 kg (122 lb 9.6 oz)   SpO2 100%   BMI 25.62 kg/m  Pain Assessment: PAINAD   Pain Score: Asleep   SpO2: SpO2: 100 % O2 Device:SpO2: 100 % O2 Flow Rate: .O2 Flow Rate (L/min): 3 L/min  IO: Intake/output summary:  Intake/Output Summary (Last 24 hours) at 02/14/16 1356 Last data filed at  02/14/16 [id number]  Gross per 24 hour  Intake                0 ml  Output             1450 ml  Net            -1450 ml    LBM: Last BM Date: 02/13/16 Baseline Weight: Weight: 55.6 kg (122 lb 9.6 oz) Most recent weight: Weight: 55.6 kg (122 lb 9.6 oz)     Palliative Assessment/Data:   Flowsheet Rows   Flowsheet Row Most Recent Value  Intake Tab  Referral Department  Hospitalist  Unit at Time of Referral  Med/Surg Unit  Palliative Care Primary Diagnosis  Pulmonary  Date Notified  02/13/16  Palliative Care Type  New Palliative care  Reason for referral  Non-pain Symptom, Clarify Goals of Care, Counsel Regarding Hospice  Date of Admission  02/12/16  Date first seen by Palliative Care  02/13/16  # of days Palliative referral response time  0 Day(s)  # of days IP prior to Palliative referral  1  Clinical Assessment  Palliative Performance Scale Score  20%  Pain Max last 24 hours  7  Pain Min Last 24 hours  6  Dyspnea Max Last 24 Hours  7  Dyspnea Min Last 24 hours  6  Psychosocial & Spiritual Assessment  Palliative Care Outcomes  Patient/Family meeting held?  Yes  Who was at the meeting?  patient's husband son and 2 dtrs along with Bambi from [place] follow-up planned  Yes, Facility      Time In:  1225 Time Out:  [id number] Time Total:  90 min  Greater than 50%  of  this time was spent counseling and coordinating care related to the above assessment and plan.  Signed by: Loistine Chance, MD  [phone]  Please contact Palliative Medicine Team phone at [phone] for questions and concerns.  For individual provider: See Shea Evans

## 2016-02-14 NOTE — Progress Notes (Signed)
Vital signs refused by patient. Informed by tech

## 2016-02-14 NOTE — Progress Notes (Signed)
Patient ID: ELI RYDALCH, female   DOB: 21-Jul-1970, 45 y.o.   MRN: [id number]    PROGRESS NOTE    LOUCINDA WORKU  [id number] DOB: 08-Feb-1971 DOA: 02/12/2016  PCP: Leonard Downing, MD   Brief Narrative:  45 y.o. 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 Patient has reportedly been on hospice at home, but has run out of her pain medications early and it would not be refilled by her hospice provider, reportedly due to concerns for possible diversion to her family and/or development of neuro toxicities.   Assessment & Plan:  Intractable pain  - A very complicated case socially and ethically - Palliative care consultation requested for symptom-control, disposition, and ethical concerns, pain management - pt reports she is still in pain and does not feel ready to go home yet - Pt confirms her wishes for palliation only; she refuses any other diagnostics or treatment - currently on fentanyl patch with IV Dilaudid, unable to take PO - possible d/c in AM if pain better controlled   DVT prophylaxis: SCD's Code Status: DNR Family Communication: Patient at bedside  Disposition Plan: Home once pain controlled   Consultants:   PCT  Procedures:   None   Antimicrobials:   None   Subjective: No events overnight.   Objective: Vitals:   02/13/16 0701 02/13/16 1447 02/13/16 2121 02/14/16 0523  BP: (!) 120/93 107/74 123/77 128/83  Pulse: 76 89 76 93  Resp: 16 16 16 16   Temp: 99.3 F (37.4 C) 98.3 F (36.8 C) 98.2 F (36.8 C) 98.7 F (37.1 C)  TempSrc: Oral Oral Oral Oral  SpO2: 100% 99% 100% 100%  Weight:      Height:        Intake/Output Summary (Last 24 hours) at 02/14/16 0642 Last data filed at 02/14/16 0524  Gross per 24 hour  Intake              360 ml  Output              950 ml  Net             -590 ml   Filed Weights   02/13/16 0108  Weight: 55.6 kg (122 lb 9.6 oz)    Examination:  General exam: Appears calm and comfortable  Respiratory system: diminished breath sounds throughout  Cardiovascular system: S1 & S2 heard, RRR.  Gastrointestinal system: Abdomen is nondistended, soft and nontender.   Data Reviewed: I have personally reviewed following labs and imaging studies  Radiology Studies: No results found.  Scheduled Meds: . antiseptic oral rinse  7 mL Mouth Rinse q12n4p  . chlorhexidine  15 mL Mouth Rinse BID  . fentaNYL  100 mcg Transdermal Q72H  . sodium chloride flush  3 mL Intravenous Q12H   Continuous Infusions:    LOS: 1 day   Time spent: 20 minutes   Faye Ramsay, MD [facility] Pager [phone]  If 7PM-7AM, please contact night-coverage [email or web address] Password [email or web address] 02/14/2016, 6:42 AM

## 2016-02-15 DIAGNOSIS — J438 Other emphysema: Secondary | ICD-10-CM

## 2016-02-15 DIAGNOSIS — R52 Pain, unspecified: Secondary | ICD-10-CM

## 2016-02-15 MED ORDER — LORAZEPAM 2 MG/ML IJ SOLN: 0.5000 mg | Freq: Once | INTRAMUSCULAR | Status: AC

## 2016-02-15 MED ORDER — LORAZEPAM 2 MG/ML IJ SOLN
INTRAMUSCULAR | Status: AC
  Administered 2016-02-15: 0.5 mg via INTRAMUSCULAR
  Filled 2016-02-15: qty 1

## 2016-02-15 NOTE — Discharge Summary (Signed)
Physician Discharge Summary  [name] [id number] DOB: 08-17-1970 DOA: 02/12/2016  PCP: Leonard Downing, MD  Admit date: 02/12/2016 Discharge date: 02/15/2016  Admitted From: Home Disposition:  Residential hospice  Recommendations for Outpatient Follow-up:  Pain management  Home Health: NO Equipment/Devices: Oxygen 3L Discharge Condition:Hospice CODE STATUS:Comfort care Diet recommendation: Regular  Brief/Interim Summary: 45 y.o.femalewith medical history significant for end-stage COPD on hospice at home who is brought into the emergency department by family with concerns for intractable pain. Patient has reportedly been on hospice at home, but has run out of her pain medications early and it would not be refilled by her hospice provider, reportedly due to concerns for possible diversion to her family and/or development of neuro toxicities.  Patient's pain was controled with IV analgesics. Palliative care was consulted for help with pain management. They recommended residential hospice for discharge.  Discharge Diagnoses:  Active Problems:   COPD (chronic obstructive pulmonary disease) ([facility])   Intractable pain   Encounter for palliative care   Chronic obstructive pulmonary disease ([place])    Discharge Instructions     Medication List    STOP taking these medications   ALPRAZolam 0.5 MG tablet Commonly known as:  XANAX   ALPRAZolam 1 MG tablet Commonly known as:  XANAX   AMITIZA 24 MCG capsule Generic drug:  lubiprostone   atorvastatin 80 MG tablet Commonly known as:  LIPITOR   budesonide-formoterol 160-4.5 MCG/ACT inhaler Commonly known as:  SYMBICORT   busPIRone 15 MG tablet Commonly known as:  BUSPAR   ciprofloxacin 500 MG tablet Commonly known as:  CIPRO   citalopram 20 MG tablet Commonly known as:  CELEXA   famotidine 20 MG tablet Commonly known as:  PEPCID   furosemide 20 MG tablet Commonly known as:  LASIX   ipratropium 0.02 %  nebulizer solution Commonly known as:  ATROVENT   montelukast 10 MG tablet Commonly known as:  SINGULAIR   morphine 100 MG 12 hr tablet Commonly known as:  MS CONTIN   morphine 30 MG tablet Commonly known as:  MSIR   predniSONE 20 MG tablet Commonly known as:  DELTASONE   predniSONE 5 MG tablet Commonly known as:  DELTASONE   SYMBICORT 160-4.5 MCG/ACT inhaler Generic drug:  budesonide-formoterol   THEO-24 200 MG 24 hr capsule Generic drug:  theophylline   theophylline 200 MG 12 hr tablet Commonly known as:  THEODUR   traMADol 50 MG tablet Commonly known as:  ULTRAM   TUDORZA PRESSAIR 400 MCG/ACT Aepb Generic drug:  Aclidinium Bromide     TAKE these medications   albuterol (2.5 MG/3ML) 0.083% nebulizer solution Commonly known as:  PROVENTIL Take 2.5 mg by nebulization every 4 (four) hours as needed for wheezing or shortness of breath. What changed:  Another medication with the same name was removed. Continue taking this medication, and follow the directions you see here.       Allergies  Allergen Reactions  . Cymbalta [Duloxetine Hcl] Nausea And Vomiting  . Pregabalin Swelling  . Wellbutrin [Bupropion] Other (See Comments)    Pt stated that this was back from the 1990's.  This medication made her very nervous    Consultations:  Palliative care   Procedures/Studies:  No results found.   Subjective: Patient was somnolent today. She responded to voice by opening her eyes. No concerns overnight per family  Discharge Exam: Vitals:   02/15/16 0652 02/15/16 1524  BP: 97/83 (!) 108/53  Pulse: (!) 109 (!) 112  Resp:  18 15  Temp: 99.8 F (37.7 C) 97.4 F (36.3 C)   Vitals:   02/14/16 0523 02/14/16 2133 02/15/16 0652 02/15/16 1524  BP: 128/83 114/74 97/83 (!) 108/53  Pulse: 93 (!) 105 (!) 109 (!) 112  Resp: 16 16 18 15   Temp: 98.7 F (37.1 C) 99.3 F (37.4 C) 99.8 F (37.7 C) 97.4 F (36.3 C)  TempSrc: Oral Oral Oral Axillary  SpO2: 100% 100%  98% 100%  Weight:      Height:        General: Pt is somnolent but arousable, not in acute distress Cardiovascular: RRR, S1/S2 +, no rubs, no gallops Respiratory: bilaterally diminished breath sounds in bases with wheezes in left lung Abdominal: Soft, NT, ND, bowel sounds + Extremities: no edema, no cyanosis    The results of significant diagnostics from this hospitalization (including imaging, microbiology, ancillary and laboratory) are listed below for reference.     Microbiology: No results found for this or any previous visit (from the past 240 hour(s)).   Labs: BNP (last 3 results) No results for input(s): BNP in the last 8760 hours. Basic Metabolic Panel: No results for input(s): NA, K, CL, CO2, GLUCOSE, BUN, CREATININE, CALCIUM, MG, PHOS in the last 168 hours. Liver Function Tests: No results for input(s): AST, ALT, ALKPHOS, BILITOT, PROT, ALBUMIN in the last 168 hours. No results for input(s): LIPASE, AMYLASE in the last 168 hours. No results for input(s): AMMONIA in the last 168 hours. CBC: No results for input(s): WBC, NEUTROABS, HGB, HCT, MCV, PLT in the last 168 hours. Cardiac Enzymes: No results for input(s): CKTOTAL, CKMB, CKMBINDEX, TROPONINI in the last 168 hours. BNP: Invalid input(s): POCBNP CBG: No results for input(s): GLUCAP in the last 168 hours. D-Dimer No results for input(s): DDIMER in the last 72 hours. Hgb A1c No results for input(s): HGBA1C in the last 72 hours. Lipid Profile No results for input(s): CHOL, HDL, LDLCALC, TRIG, CHOLHDL, LDLDIRECT in the last 72 hours. Thyroid function studies No results for input(s): TSH, T4TOTAL, T3FREE, THYROIDAB in the last 72 hours.  Invalid input(s): FREET3 Anemia work up No results for input(s): VITAMINB12, FOLATE, FERRITIN, TIBC, IRON, RETICCTPCT in the last 72 hours. Urinalysis No results found for: COLORURINE, APPEARANCEUR, LABSPEC, [place], GLUCOSEU, HGBUR, BILIRUBINUR, KETONESUR, PROTEINUR,  UROBILINOGEN, NITRITE, LEUKOCYTESUR Sepsis Labs Invalid input(s): PROCALCITONIN,  WBC,  LACTICIDVEN Microbiology No results found for this or any previous visit (from the past 240 hour(s)).   Time coordinating discharge: Over 30 minutes  SIGNED:   Cordelia Poche, MD  [facility] 02/15/2016, 3:47 PM Pager   If 7PM-7AM, please contact night-coverage [email or web address] Password [email or web address]

## 2016-02-15 NOTE — Clinical Social Work Note (Signed)
Clinical Social Work Assessment  Patient Details  Name: [name] MRN: [id number] Date of Birth: 09-16-1970  Date of referral:  02/15/16               Reason for consult:  Discharge Planning                Permission sought to share information with:  Chartered certified accountant granted to share information::  Yes, Verbal Permission Granted  Name::        Agency::     Relationship::     Contact Information:     Housing/Transportation Living arrangements for the past 2 months:  Single Family Home Source of Information:  Spouse Patient Interpreter Needed:    Criminal Activity/Legal Involvement Pertinent to Current Situation/Hospitalization:  No - Comment as needed Significant Relationships:  Adult Children, Siblings, Spouse Lives with:  Spouse Do you feel safe going back to the place where you live?  No Need for family participation in patient care:  Yes (Comment)  Care giving concerns:  Pt's care cannot be managed at home following hospital d/c.   Social Worker assessment / plan:  Pt hospitalized on 02/12/16 with intractable pain. Palliative care Team has met with pt / family and [place] recommended. Family has chosen [facility] for placement. Referral has been made and a decision is pending. Spouse has been updated . CSW will alert spouse once [facility] has made their decision.   Employment status:  Disabled (Comment on whether or not currently receiving Disability) Insurance information:  Medicaid [place] PT Recommendations:  Not assessed at this time Information / Referral to community resources:  Other (Comment Required) (Residential Hospice Homes)  Patient/Family's Response to care:  Family agrees with plan for residential hospice home placement.   Patient/Family's Understanding of and Emotional Response to Diagnosis, Current Treatment, and Prognosis: Family has met with Palliative Care and is aware of pt's prognosis. Family agrees  hospice home placement is best for pt at this time. Support provided.  Emotional Assessment Appearance:  Appears older than stated age Attitude/Demeanor/Rapport:  Unable to Assess Affect (typically observed):  Unable to Assess Orientation:    Alcohol / Substance use:  Not Applicable Psych involvement (Current and /or in the community):  No (Comment)  Discharge Needs  Concerns to be addressed:  Discharge Planning Concerns Readmission within the last 30 days:  No Current discharge risk:  None Barriers to Discharge:  No Barriers Identified   Luretha Rued, [place] 02/15/2016, 9:59 AM

## 2016-02-15 NOTE — Progress Notes (Signed)
Nutrition Brief Note  Pt identified as at nutrition risk on the Malnutrition Screen Tool  Chart reviewed. Pt now transitioning to comfort care.  No nutrition interventions warranted at this time.  Please consult as needed.   Eragon Hammond, MS, RD, LDN Pager: [phone] After Hours Pager: [phone]    

## 2016-02-15 NOTE — Progress Notes (Signed)
CSW aware of consult for residential hopice home placement. CSW cell # provided to pt's son this am. He will gather family members this am and contact CSW to return to pt's room to assist with placement.  Werner Lean LCSW [phone]

## 2016-02-15 NOTE — Progress Notes (Signed)
Patient ID: [name], female   DOB: 26-Apr-1971, 45 y.o.   MRN: [id number]    PROGRESS NOTE    [name]  [id number] DOB: 02/07/1971 DOA: 02/12/2016  PCP: Leonard Downing, MD   Brief Narrative:  45 y.o. 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 Patient has reportedly been on hospice at home, but has run out of her pain medications early and it would not be refilled by her hospice provider, reportedly due to concerns for possible diversion to her family and/or development of neuro toxicities.   Assessment & Plan:  Intractable pain  End-stage COPD Palliative care recommendations residential hospice. Patient was very somnolent on exam. Looking through chart, appears the patient was responsive in the past. This is likely secondary to oversedation as she is getting Dilaudid 3 mg IV pre-frequently. Discussed with nurse, and it seems family is requesting frequent dosing of this medication the patient does not appear in pain. Discussed with RN to try and space out Dilaudid a little bit more. If not, may need to decrease dose available bit so patient is not as somnolent. - Palliative care consultation requested for symptom-control, disposition, and ethical concerns, pain management - pt reports she is still in pain and does not feel ready to go home yet - Pt confirms her wishes for palliation only; she refuses any other diagnostics or treatment - Continue fentanyl patch with IV Dilaudid, unable to take PO - possible d/c in AM if pain better controlled   DVT prophylaxis: SCD's Code Status: DNR Family Communication: Family at bedside Disposition Plan: Residential hospice possibly today or tomorrow pending an open bed  Consultants:   PCT  Procedures:   None   Antimicrobials:   None   Subjective: Patient appears to have her pain under  control. She is not very responsive except for calling out for pain control at times and also responding somewhat with opening her eyes.  Objective: Vitals:   02/13/16 2121 02/14/16 0523 02/14/16 2133 02/15/16 0652  BP: 123/77 128/83 114/74 97/83  Pulse: 76 93 (!) 105 (!) 109  Resp: 16 16 16 18   Temp:  98.7 F (37.1 C) 99.3 F (37.4 C) 99.8 F (37.7 C)  TempSrc: Oral Oral Oral Oral  SpO2: 100% 100% 100% 98%  Weight:      Height:        Intake/Output Summary (Last 24 hours) at 02/15/16 1217 Last data filed at 02/14/16 1434  Gross per 24 hour  Intake                0 ml  Output                0 ml  Net                0 ml   Filed Weights   02/13/16 0108  Weight: 55.6 kg (122 lb 9.6 oz)    Examination:  General exam: Appears calm and comfortable. Patient responds to voice with eyes opening, but not much else. Does not follow commands Respiratory system: diminished breath sounds throughout with some mild wheezing at LL base Cardiovascular system: S1 & S2 heard, RRR.  Gastrointestinal system: Abdomen is nondistended, soft and nontender.   Data Reviewed: I have personally reviewed following labs and imaging studies  Radiology Studies: No results found.  Scheduled Meds: . antiseptic oral rinse  7  mL Mouth Rinse q12n4p  . chlorhexidine  15 mL Mouth Rinse BID  . fentaNYL  100 mcg Transdermal Q72H  . sodium chloride flush  3 mL Intravenous Q12H   Continuous Infusions:    LOS: 2 days    Cordelia Poche, MD [facility] Pager [phone]  If 7PM-7AM, please contact night-coverage [email or web address] Password [email or web address] 02/15/2016, 12:17 PM

## 2016-02-15 NOTE — Progress Notes (Signed)
Pt has a bed at [facility] today. Family is in agreement with d/c plan. [facility] transport is required. Medical necessity form has been completed. # for report has been provided to nsg. D/C Summary is available in epic for hospice home to review.  Werner Lean LCSW [phone]

## 2016-02-15 NOTE — Progress Notes (Signed)
Report called to [facility] @ [place]. All questions answered. Request that pt be transported with IV access. IV will not be d/c.

## 2016-02-15 NOTE — Progress Notes (Signed)
[facility] has no openings today. Spouse is aware and has chosen [facility] at [facility]. CSW has contacted hospice home and a  Referral has been made. CSW will alert spouse when a decision has been made.  Werner Lean LCSW [phone]

## 2016-02-15 NOTE — Care Management Note (Signed)
Case Management Note  Patient Details  Name: [name] MRN: [id number] Date of Birth: 05-Feb-1971  Subjective/Objective:  45 yo admitted with end stage COPD.                  Action/Plan: Pt from home with family and [facility] services.  CSW is working with pt and family for residential hospice placement. CM will continue to follow and assist as needed.  Expected Discharge Date:   (unknown)               Expected Discharge Plan:  [facility]  In-House Referral:  Clinical Social Work  Discharge planning Services  CM Consult  Post Acute Care Choice:    Choice offered to:     DME Arranged:    DME Agency:     HH Arranged:    [place] Agency:     Status of Service:  In process, will continue to follow  If discussed at Long Length of Stay Meetings, dates discussed:    Additional CommentsLynnell Catalan, RN 02/15/2016, 9:42 AM  [phone]

## 2016-03-09 DEATH — deceased
